# Patient Record
Sex: Male | Born: 1985 | Race: White | Hispanic: No | Marital: Single | Smoking: Current some day smoker
Health system: Southern US, Community
[De-identification: ages and names within clinical notes are randomized; demographics above are authoritative.]

## PROBLEM LIST (undated history)

## (undated) DIAGNOSIS — F1110 Opioid abuse, uncomplicated: Secondary | ICD-10-CM

## (undated) HISTORY — PX: FRACTURE SURGERY: SHX138

---
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
## 2003-01-05 ENCOUNTER — Emergency Department (HOSPITAL_COMMUNITY): Admission: EM | Admit: 2003-01-05 | Discharge: 2003-01-06 | Payer: Self-pay | Admitting: Emergency Medicine

## 2003-01-06 ENCOUNTER — Encounter: Payer: Self-pay | Admitting: Emergency Medicine

## 2003-10-03 ENCOUNTER — Emergency Department (HOSPITAL_COMMUNITY): Admission: EM | Admit: 2003-10-03 | Discharge: 2003-10-03 | Payer: Self-pay | Admitting: Emergency Medicine

## 2003-11-02 ENCOUNTER — Emergency Department (HOSPITAL_COMMUNITY): Admission: EM | Admit: 2003-11-02 | Discharge: 2003-11-02 | Payer: Self-pay | Admitting: Emergency Medicine

## 2004-01-11 ENCOUNTER — Emergency Department (HOSPITAL_COMMUNITY): Admission: EM | Admit: 2004-01-11 | Discharge: 2004-01-11 | Payer: Self-pay | Admitting: Emergency Medicine

## 2006-06-01 ENCOUNTER — Emergency Department (HOSPITAL_COMMUNITY): Admission: EM | Admit: 2006-06-01 | Discharge: 2006-06-01 | Payer: Self-pay | Admitting: Emergency Medicine

## 2007-08-07 ENCOUNTER — Emergency Department (HOSPITAL_COMMUNITY): Admission: EM | Admit: 2007-08-07 | Discharge: 2007-08-07 | Payer: Self-pay | Admitting: Emergency Medicine

## 2008-12-05 ENCOUNTER — Emergency Department (HOSPITAL_COMMUNITY): Admission: EM | Admit: 2008-12-05 | Discharge: 2008-12-05 | Payer: Self-pay | Admitting: *Deleted

## 2009-01-14 ENCOUNTER — Emergency Department (HOSPITAL_COMMUNITY): Admission: EM | Admit: 2009-01-14 | Discharge: 2009-01-14 | Payer: Self-pay | Admitting: Emergency Medicine

## 2009-10-29 ENCOUNTER — Emergency Department (HOSPITAL_COMMUNITY): Admission: EM | Admit: 2009-10-29 | Discharge: 2009-10-29 | Payer: Self-pay | Admitting: Emergency Medicine

## 2009-11-28 ENCOUNTER — Emergency Department (HOSPITAL_COMMUNITY): Admission: EM | Admit: 2009-11-28 | Discharge: 2009-11-28 | Payer: Self-pay | Admitting: Emergency Medicine

## 2010-05-15 ENCOUNTER — Emergency Department (HOSPITAL_COMMUNITY): Admission: EM | Admit: 2010-05-15 | Discharge: 2010-05-15 | Payer: Self-pay | Admitting: Emergency Medicine

## 2010-05-22 ENCOUNTER — Emergency Department (HOSPITAL_COMMUNITY): Admission: EM | Admit: 2010-05-22 | Discharge: 2010-05-22 | Payer: Self-pay | Admitting: Emergency Medicine

## 2010-05-24 ENCOUNTER — Emergency Department (HOSPITAL_COMMUNITY): Admission: EM | Admit: 2010-05-24 | Discharge: 2010-05-24 | Payer: Self-pay | Admitting: Emergency Medicine

## 2010-05-28 ENCOUNTER — Emergency Department (HOSPITAL_COMMUNITY): Admission: EM | Admit: 2010-05-28 | Discharge: 2010-05-28 | Payer: Self-pay | Admitting: Emergency Medicine

## 2010-06-01 ENCOUNTER — Emergency Department (HOSPITAL_COMMUNITY): Admission: EM | Admit: 2010-06-01 | Discharge: 2010-06-01 | Payer: Self-pay | Admitting: Emergency Medicine

## 2010-06-07 ENCOUNTER — Ambulatory Visit (HOSPITAL_COMMUNITY): Admission: RE | Admit: 2010-06-07 | Discharge: 2010-06-07 | Payer: Self-pay | Admitting: Otolaryngology

## 2011-03-11 LAB — CBC
Hemoglobin: 15.3 g/dL (ref 13.0–17.0)
RBC: 4.71 MIL/uL (ref 4.22–5.81)
WBC: 9.8 10*3/uL (ref 4.0–10.5)

## 2011-03-12 LAB — POCT I-STAT, CHEM 8
BUN: 9 mg/dL (ref 6–23)
Creatinine, Ser: 1.3 mg/dL (ref 0.4–1.5)
Sodium: 139 mEq/L (ref 135–145)
TCO2: 20 mmol/L (ref 0–100)

## 2011-03-12 LAB — ETHANOL: Alcohol, Ethyl (B): 99 mg/dL — ABNORMAL HIGH (ref 0–10)

## 2011-03-12 LAB — CBC
HCT: 45.7 % (ref 39.0–52.0)
Hemoglobin: 16 g/dL (ref 13.0–17.0)
Platelets: 194 10*3/uL (ref 150–400)
WBC: 14.5 10*3/uL — ABNORMAL HIGH (ref 4.0–10.5)

## 2011-03-12 LAB — DIFFERENTIAL
Eosinophils Relative: 0 % (ref 0–5)
Lymphocytes Relative: 9 % — ABNORMAL LOW (ref 12–46)
Lymphs Abs: 1.4 10*3/uL (ref 0.7–4.0)

## 2011-04-09 LAB — RAPID URINE DRUG SCREEN, HOSP PERFORMED
Amphetamines: NOT DETECTED
Barbiturates: NOT DETECTED
Benzodiazepines: NOT DETECTED
Cocaine: POSITIVE — AB
Opiates: NOT DETECTED

## 2011-04-09 LAB — POCT CARDIAC MARKERS: Myoglobin, poc: 121 ng/mL (ref 12–200)

## 2011-04-22 ENCOUNTER — Emergency Department (HOSPITAL_COMMUNITY)
Admission: EM | Admit: 2011-04-22 | Discharge: 2011-04-22 | Disposition: A | Discharge status: Home / Self Care | Payer: Self-pay | Attending: Emergency Medicine | Admitting: Emergency Medicine

## 2011-04-22 DIAGNOSIS — R109 Unspecified abdominal pain: Secondary | ICD-10-CM | POA: Insufficient documentation

## 2011-04-22 DIAGNOSIS — B029 Zoster without complications: Secondary | ICD-10-CM | POA: Insufficient documentation

## 2011-04-22 LAB — URINALYSIS, ROUTINE W REFLEX MICROSCOPIC
Glucose, UA: NEGATIVE mg/dL
Specific Gravity, Urine: 1.017 (ref 1.005–1.030)
pH: 6.5 (ref 5.0–8.0)

## 2011-07-17 ENCOUNTER — Emergency Department (HOSPITAL_COMMUNITY)
Admission: EM | Admit: 2011-07-17 | Discharge: 2011-07-17 | Disposition: A | Discharge status: Home / Self Care | Payer: Self-pay | Attending: Emergency Medicine | Admitting: Emergency Medicine

## 2011-07-17 DIAGNOSIS — M549 Dorsalgia, unspecified: Secondary | ICD-10-CM | POA: Insufficient documentation

## 2011-07-17 DIAGNOSIS — M545 Low back pain, unspecified: Secondary | ICD-10-CM | POA: Insufficient documentation

## 2011-08-17 ENCOUNTER — Emergency Department (HOSPITAL_COMMUNITY)
Admission: EM | Admit: 2011-08-17 | Discharge: 2011-08-17 | Disposition: A | Discharge status: Home / Self Care | Payer: Self-pay | Attending: Emergency Medicine | Admitting: Emergency Medicine

## 2011-08-17 DIAGNOSIS — M7989 Other specified soft tissue disorders: Secondary | ICD-10-CM | POA: Insufficient documentation

## 2011-08-17 DIAGNOSIS — T63461A Toxic effect of venom of wasps, accidental (unintentional), initial encounter: Secondary | ICD-10-CM | POA: Insufficient documentation

## 2011-08-17 DIAGNOSIS — T6391XA Toxic effect of contact with unspecified venomous animal, accidental (unintentional), initial encounter: Secondary | ICD-10-CM | POA: Insufficient documentation

## 2011-10-08 LAB — DIFFERENTIAL
Basophils Absolute: 0
Basophils Relative: 0
Eosinophils Absolute: 0.2
Monocytes Absolute: 0.7
Neutro Abs: 6.1
Neutrophils Relative %: 57

## 2011-10-08 LAB — POCT CARDIAC MARKERS
CKMB, poc: <1 — ABNORMAL LOW
Operator id: 208821
Operator id: 208821
Troponin i, poc: <0.05
Troponin i, poc: <0.05

## 2011-10-08 LAB — COMPREHENSIVE METABOLIC PANEL
ALT: 21
Alkaline Phosphatase: 72
BUN: 11
Chloride: 107
Glucose, Bld: 92
Potassium: 3.7
Sodium: 140
Total Bilirubin: 0.7

## 2011-10-08 LAB — CBC
HCT: 43.2
Hemoglobin: 15
RBC: 4.83
WBC: 10.6 — ABNORMAL HIGH

## 2011-10-08 LAB — LIPASE, BLOOD: Lipase: 19

## 2011-11-24 ENCOUNTER — Encounter: Payer: Self-pay | Admitting: *Deleted

## 2011-11-24 ENCOUNTER — Emergency Department (HOSPITAL_COMMUNITY)
Admission: EM | Admit: 2011-11-24 | Discharge: 2011-11-24 | Disposition: A | Discharge status: Home / Self Care | Payer: Self-pay | Attending: Emergency Medicine | Admitting: Emergency Medicine

## 2011-11-24 DIAGNOSIS — R3589 Other polyuria: Secondary | ICD-10-CM | POA: Insufficient documentation

## 2011-11-24 DIAGNOSIS — R358 Other polyuria: Secondary | ICD-10-CM | POA: Insufficient documentation

## 2011-11-24 DIAGNOSIS — R634 Abnormal weight loss: Secondary | ICD-10-CM | POA: Insufficient documentation

## 2011-11-24 LAB — CBC
HCT: 43.1 % (ref 39.0–52.0)
MCH: 31.4 pg (ref 26.0–34.0)
MCHC: 35.5 g/dL (ref 30.0–36.0)
MCV: 88.3 fL (ref 78.0–100.0)
RDW: 12.7 % (ref 11.5–15.5)

## 2011-11-24 LAB — URINALYSIS, ROUTINE W REFLEX MICROSCOPIC
Bilirubin Urine: NEGATIVE
Ketones, ur: NEGATIVE mg/dL
Nitrite: NEGATIVE
Protein, ur: NEGATIVE mg/dL
Urobilinogen, UA: 0.2 mg/dL (ref 0.0–1.0)
pH: 7.5 (ref 5.0–8.0)

## 2011-11-24 LAB — BASIC METABOLIC PANEL
BUN: 13 mg/dL (ref 6–23)
Calcium: 9.1 mg/dL (ref 8.4–10.5)
Creatinine, Ser: 0.9 mg/dL (ref 0.50–1.35)
GFR calc Af Amer: >90 mL/min (ref >90)
GFR calc non Af Amer: >90 mL/min (ref >90)

## 2011-11-24 NOTE — ED Provider Notes (Signed)
History     CSN: [id number] Arrival date & time: 11/24/2011 10:47 AM   First MD Initiated Contact with Patient 11/24/11 1124      Chief Complaint  Patient presents with  . Weight Loss    (Consider location/radiation/quality/duration/timing/severity/associated sxs/prior treatment) HPI History provided by pt.   Pt c/o 18-20lbs weight loss over the past 1.5 months.  Started jogging approx 3 weeks ago.  Believes that he is more hungry and thirsty than nml and has polyuria as well.  No known FH Type 1 diabetes.  No recent vomiting, diarrhea, blood in stool but has noticed something white in stool recently.  It may have been the corn he ate the night before but his friend told him that it could be worms and now he is worried.  Denies peri-anal pruritis/pain.  Has not had fever, fatigue, night sweats, rash. Has had a cough for the past week.  Quit smoking 2 days ago.    History reviewed. No pertinent past medical history.  History reviewed. No pertinent past surgical history.  No family history on file.  History  Substance Use Topics  . Smoking status: Former Games developer  . Smokeless tobacco: Not on file  . Alcohol Use: No      Review of Systems  All other systems reviewed and are negative.    Allergies  Review of patient's allergies indicates no known allergies.  Home Medications   Current Outpatient Rx  Name Route Sig Dispense Refill  . OMEGA-3 FATTY ACIDS 1000 MG PO CAPS Oral Take 1 g by mouth daily.      Marland Kitchen GARLIC 10 MG PO CAPS Oral Take 1 capsule by mouth daily.      Marland Kitchen POTASSIUM GLUCONATE 595 MG PO CAPS Oral Take 1 capsule by mouth daily.        BP 125/81  Pulse 54  Resp 18  SpO2 97%  Physical Exam  Nursing note and vitals reviewed. Constitutional: He is oriented to person, place, and time. He appears well-developed and well-nourished. No distress.  HENT:  Head: Normocephalic and atraumatic.  Eyes:       Normal appearance  Neck: Normal range of motion.    Cardiovascular: Normal rate and regular rhythm.   Pulmonary/Chest: Effort normal and breath sounds normal.  Abdominal: Soft. Bowel sounds are normal. He exhibits no distension. There is no tenderness.  Genitourinary:       Testicles w/out edema, mass, tenderness.  Pt points to small lump he found on right testicle and it appears to be where epididymus meets testicle.  Musculoskeletal: He exhibits no edema and no tenderness.  Neurological: He is alert and oriented to person, place, and time.  Skin: Skin is warm and dry. No rash noted.  Psychiatric: He has a normal mood and affect. His behavior is normal.    ED Course  Procedures (including critical care time)   Labs Reviewed  CBC  BASIC METABOLIC PANEL  URINALYSIS, ROUTINE W REFLEX MICROSCOPIC   No results found.   1. Weight loss       MDM  Pt presents w/ c/o unintentional weight loss.  Recently started exercising.  No other sx suggestive of malignancy.  Pt looks well; no acute findings on exam.  Testicular exam performed because pt reports feeling a small lump that is ttp.  Testicles w/out masses/tenderness and I believe pt was feeling epididymus where it meets testicle on right.  Advised him to continue monthly exams.   Labs unremarkable.  Pt has  been reassured but advised to check weight once a week and follow up with a PCP for further evaluation.        Otilio Miu, [place] 11/24/11 2350

## 2011-11-24 NOTE — ED Notes (Addendum)
Patient ambulated to BR without difficulty.

## 2011-11-24 NOTE — ED Notes (Addendum)
Patient returned from BR.

## 2011-11-24 NOTE — ED Notes (Signed)
Pt concerned he eats and lives a healthy lifestyle but frequently feels hungry after eating large amts of food, friend told him he may have worms, pt presents to see

## 2011-11-25 NOTE — ED Provider Notes (Signed)
Medical screening examination/treatment/procedure(s) were performed by non-physician practitioner and as supervising physician I was immediately available for consultation/collaboration.   Dayton Bailiff, MD 11/25/11 [phone]

## 2013-08-31 ENCOUNTER — Encounter (HOSPITAL_COMMUNITY): Payer: Self-pay | Admitting: Emergency Medicine

## 2013-08-31 ENCOUNTER — Emergency Department (HOSPITAL_COMMUNITY)
Admission: EM | Admit: 2013-08-31 | Discharge: 2013-08-31 | Disposition: A | Discharge status: Home / Self Care | Payer: Self-pay | Attending: Emergency Medicine | Admitting: Emergency Medicine

## 2013-08-31 ENCOUNTER — Emergency Department (HOSPITAL_COMMUNITY): Payer: Self-pay

## 2013-08-31 DIAGNOSIS — Z79899 Other long term (current) drug therapy: Secondary | ICD-10-CM | POA: Insufficient documentation

## 2013-08-31 DIAGNOSIS — F17200 Nicotine dependence, unspecified, uncomplicated: Secondary | ICD-10-CM | POA: Insufficient documentation

## 2013-08-31 DIAGNOSIS — M25549 Pain in joints of unspecified hand: Secondary | ICD-10-CM | POA: Insufficient documentation

## 2013-08-31 DIAGNOSIS — M79641 Pain in right hand: Secondary | ICD-10-CM

## 2013-08-31 MED ORDER — TRAMADOL HCL 50 MG PO TABS: 50.0000 mg | ORAL_TABLET | Freq: Four times a day (QID) | ORAL | Status: DC | PRN

## 2013-08-31 MED ORDER — TRAMADOL HCL 50 MG PO TABS
50.0000 mg | ORAL_TABLET | Freq: Once | ORAL | Status: AC
  Administered 2013-08-31: 50 mg via ORAL
  Filled 2013-08-31: qty 1

## 2013-08-31 NOTE — Progress Notes (Signed)
[facility] provided pt with a list of primary care resources and a [facility] [employer] [facility] application.  °

## 2013-08-31 NOTE — ED Notes (Signed)
Pt reports 8/10 right index pain at the proximal joint. Pt reports that the pain has been increasing over the past three weeks and that there is a "knot" in that location. Pt denies any injury or trauma. Pt states he has been unable to work out as usual at the gym due to the pain. Pt is A/Ox4 and in NAD.

## 2013-08-31 NOTE — ED Provider Notes (Signed)
CSN: [id number]     Arrival date & time 08/31/13  1134 History   First MD Initiated Contact with Patient 08/31/13 1148     Chief Complaint  Patient presents with  . Hand Pain   (Consider location/radiation/quality/duration/timing/severity/associated sxs/prior Treatment) HPI Comments: Patient presenting with a mass of his right index finger at the level of the MCP.  He reports that he noticed the mass three weeks ago, but it just started to become painful over the past couple of days.  He reports that he does not think that the mass has changed in size.  Denies any injury to the area.  Denies any erythema or swelling of the area.  He has full ROM of his finger.  Denies fever or chills.  Reports that he has never had anything like this before.  The history is provided by the patient.    History reviewed. No pertinent past medical history. History reviewed. No pertinent past surgical history. No family history on file. History  Substance Use Topics  . Smoking status: Current Some Day Smoker -- 0.25 packs/day for 10 years    Types: Cigarettes  . Smokeless tobacco: Current User    Types: Chew  . Alcohol Use: No    Review of Systems  All other systems reviewed and are negative.    Allergies  Review of patient's allergies indicates no known allergies.  Home Medications   Current Outpatient Rx  Name  Route  Sig  Dispense  Refill  . acetaminophen (TYLENOL) 500 MG tablet   Oral   Take 1,000 mg by mouth every 6 (six) hours as needed for pain.         . vitamin C (ASCORBIC ACID) 500 MG tablet   Oral   Take 1,000 mg by mouth every morning.          BP 122/69  Pulse 44  Temp(Src) 98 F (36.7 C) (Oral)  Resp 20  SpO2 99% Physical Exam  Nursing note and vitals reviewed. Constitutional: He appears well-developed and well-nourished.  HENT:  Head: Normocephalic and atraumatic.  Mouth/Throat: Oropharynx is clear and moist.  Neck: Normal range of motion. Neck supple.   Cardiovascular: Normal rate, regular rhythm and normal heart sounds.   Musculoskeletal:  Small approximately 5 mm round bony growth at the MCP of the right index finger on the palmar surface.  Full ROM of the MCP.  No erythema, edema, or warmth.  Neurological: He is alert.  Skin: Skin is warm and dry. No erythema.  Psychiatric: He has a normal mood and affect.    ED Course  Procedures (including critical care time) Labs Review Labs Reviewed - No data to display Imaging Review Dg Hand Complete Right  08/31/2013   *RADIOLOGY REPORT*  Clinical Data: Right hand pain  RIGHT HAND - COMPLETE 3+ VIEW  Comparison: None.  Findings: No acute fracture or dislocation is noted.  No gross soft tissue abnormality is seen.  IMPRESSION: No acute abnormality noted.   Original Report Authenticated By: Alcide Clever, M.D.    MDM  No diagnosis found. Patient presenting with a small growth of the MCP of the right index finger.  Xray negative.  He denies trauma to the area.  No signs of infection.  Patient referred to Hand Surgery.    Pascal Lux [place], PA-C 09/03/13 1105

## 2013-09-04 NOTE — ED Provider Notes (Signed)
Medical screening examination/treatment/procedure(s) were performed by non-physician practitioner and as supervising physician I was immediately available for consultation/collaboration.   Audree Camel, MD 09/04/13 [phone]

## 2014-04-18 ENCOUNTER — Encounter (HOSPITAL_COMMUNITY): Payer: Self-pay | Admitting: Emergency Medicine

## 2014-04-18 ENCOUNTER — Emergency Department (HOSPITAL_COMMUNITY)
Admission: EM | Admit: 2014-04-18 | Discharge: 2014-04-18 | Disposition: A | Discharge status: Home / Self Care | Payer: Self-pay | Attending: Emergency Medicine | Admitting: Emergency Medicine

## 2014-04-18 DIAGNOSIS — F17200 Nicotine dependence, unspecified, uncomplicated: Secondary | ICD-10-CM | POA: Insufficient documentation

## 2014-04-18 DIAGNOSIS — J029 Acute pharyngitis, unspecified: Secondary | ICD-10-CM

## 2014-04-18 DIAGNOSIS — Z79899 Other long term (current) drug therapy: Secondary | ICD-10-CM | POA: Insufficient documentation

## 2014-04-18 DIAGNOSIS — J40 Bronchitis, not specified as acute or chronic: Secondary | ICD-10-CM

## 2014-04-18 DIAGNOSIS — J209 Acute bronchitis, unspecified: Secondary | ICD-10-CM | POA: Insufficient documentation

## 2014-04-18 MED ORDER — DOXYCYCLINE HYCLATE 100 MG PO TABS
100.0000 mg | ORAL_TABLET | Freq: Once | ORAL | Status: AC
Start: 2014-04-18 — End: 2014-04-18
  Administered 2014-04-18: 100 mg via ORAL
  Filled 2014-04-18: qty 1

## 2014-04-18 MED ORDER — DOXYCYCLINE HYCLATE 100 MG PO CAPS: 100.0000 mg | ORAL_CAPSULE | Freq: Two times a day (BID) | ORAL | Status: DC

## 2014-04-18 MED ORDER — IBUPROFEN 600 MG PO TABS: 600.0000 mg | ORAL_TABLET | Freq: Three times a day (TID) | ORAL | Status: DC | PRN

## 2014-04-18 MED ORDER — IBUPROFEN 800 MG PO TABS
800.0000 mg | ORAL_TABLET | Freq: Once | ORAL | Status: AC
  Administered 2014-04-18: 800 mg via ORAL
  Filled 2014-04-18: qty 1

## 2014-04-18 MED ORDER — HYDROCODONE-ACETAMINOPHEN 5-325 MG PO TABS
1.0000 | ORAL_TABLET | Freq: Once | ORAL | Status: AC
  Administered 2014-04-18: 1 via ORAL
  Filled 2014-04-18: qty 1

## 2014-04-18 MED ORDER — IBUPROFEN 200 MG PO TABS: 600.0000 mg | ORAL_TABLET | Freq: Once | ORAL | Status: DC

## 2014-04-18 NOTE — ED Notes (Addendum)
Pt has been coughing for two weeks with sore throat.  Pt woke up this am with coughing with blood tinged sputum x 3.  No fever at home.

## 2014-04-18 NOTE — ED Provider Notes (Signed)
CSN: [id number]     Arrival date & time 04/18/14  0915 History   First MD Initiated Contact with Patient 04/18/14 [phone]     Chief Complaint  Patient presents with  . Sore Throat  . Cough      HPI Patient for sore throat of the past 24 hours.  This morning he developed the cough it made his throat hurt worse.  Small amount of blood came up mixed with mucus.  He reports congestion over the past several days.  He denies shortness of breath.  No fevers or chills.  He smokes cigarettes.  No other complaints.   History reviewed. No pertinent past medical history. History reviewed. No pertinent past surgical history. History reviewed. No pertinent family history. History  Substance Use Topics  . Smoking status: Current Some Day Smoker -- 0.25 packs/day for 10 years    Types: Cigarettes  . Smokeless tobacco: Current User    Types: Chew  . Alcohol Use: Yes     Comment: 2 x week    Review of Systems  All other systems reviewed and are negative.     Allergies  Review of patient's allergies indicates no known allergies.  Home Medications   Prior to Admission medications   Medication Sig Start Date End Date Taking? Authorizing Provider  acetaminophen (TYLENOL) 500 MG tablet Take 1,000 mg by mouth every 6 (six) hours as needed for pain.    Historical Provider, MD  doxycycline (VIBRAMYCIN) 100 MG capsule Take 1 capsule (100 mg total) by mouth 2 (two) times daily. 04/18/14   Lyanne CoKevin M Skyelynn Rambeau, MD  ibuprofen (ADVIL,MOTRIN) 600 MG tablet Take 1 tablet (600 mg total) by mouth every 8 (eight) hours as needed. 04/18/14   Lyanne CoKevin M Harshith Pursell, MD  traMADol (ULTRAM) 50 MG tablet Take 1 tablet (50 mg total) by mouth every 6 (six) hours as needed for pain. 08/31/13   Heather Laisure, PA-C  vitamin C (ASCORBIC ACID) 500 MG tablet Take 1,000 mg by mouth every morning.    Historical Provider, MD   BP 139/88  Pulse 64  Temp(Src) 98.1 F (36.7 C) (Oral)  Resp 18  SpO2 97% Physical Exam  Nursing note and  vitals reviewed. Constitutional: He is oriented to person, place, and time. He appears well-developed and well-nourished.  Non-toxic appearance. He does not appear ill. No distress.  HENT:  Head: Normocephalic and atraumatic.  Mouth/Throat: Uvula is midline and mucous membranes are normal. Normal dentition. No uvula swelling. Posterior oropharyngeal erythema present. No oropharyngeal exudate.  Eyes: EOM are normal.  Neck: Normal range of motion. Neck supple. No tracheal tenderness present. No mass and no thyromegaly present.  Cardiovascular: Normal rate, regular rhythm, normal heart sounds and intact distal pulses.   Pulmonary/Chest: Effort normal and breath sounds normal. No stridor. No respiratory distress.  Abdominal: Soft. He exhibits no distension. There is no tenderness.  Musculoskeletal: Normal range of motion.  Neurological: He is alert and oriented to person, place, and time.  Skin: Skin is warm and dry.  Psychiatric: He has a normal mood and affect. Judgment normal.    ED Course  Procedures (including critical care time) Labs Review Labs Reviewed - No data to display  Imaging Review No results found.   EKG Interpretation None      MDM   Final diagnoses:  Pharyngitis  Bronchitis    Pharyngitis/bronchitis.  No hypoxia.  Vital signs normal.  Discharge home in good condition.    Lyanne CoKevin M Axell Trigueros, MD 04/18/14  0944 

## 2014-04-18 NOTE — Discharge Instructions (Signed)

## 2014-12-14 ENCOUNTER — Emergency Department (HOSPITAL_COMMUNITY): Payer: Self-pay

## 2014-12-14 ENCOUNTER — Emergency Department (HOSPITAL_COMMUNITY)
Admission: EM | Admit: 2014-12-14 | Discharge: 2014-12-14 | Disposition: A | Discharge status: Home / Self Care | Payer: Self-pay | Attending: Emergency Medicine | Admitting: Emergency Medicine

## 2014-12-14 ENCOUNTER — Encounter (HOSPITAL_COMMUNITY): Payer: Self-pay | Admitting: Emergency Medicine

## 2014-12-14 DIAGNOSIS — Z79899 Other long term (current) drug therapy: Secondary | ICD-10-CM | POA: Insufficient documentation

## 2014-12-14 DIAGNOSIS — R0789 Other chest pain: Secondary | ICD-10-CM | POA: Insufficient documentation

## 2014-12-14 DIAGNOSIS — Z792 Long term (current) use of antibiotics: Secondary | ICD-10-CM | POA: Insufficient documentation

## 2014-12-14 DIAGNOSIS — M25512 Pain in left shoulder: Secondary | ICD-10-CM | POA: Insufficient documentation

## 2014-12-14 DIAGNOSIS — Z720 Tobacco use: Secondary | ICD-10-CM | POA: Insufficient documentation

## 2014-12-14 DIAGNOSIS — R079 Chest pain, unspecified: Secondary | ICD-10-CM

## 2014-12-14 LAB — CBC
HEMATOCRIT: 45.3 % (ref 39.0–52.0)
Hemoglobin: 15.8 g/dL (ref 13.0–17.0)
MCH: 31.5 pg (ref 26.0–34.0)
MCHC: 34.9 g/dL (ref 30.0–36.0)
MCV: 90.2 fL (ref 78.0–100.0)
PLATELETS: 201 10*3/uL (ref 150–400)
RBC: 5.02 MIL/uL (ref 4.22–5.81)
RDW: 12.7 % (ref 11.5–15.5)
WBC: 7.5 10*3/uL (ref 4.0–10.5)

## 2014-12-14 LAB — BASIC METABOLIC PANEL
ANION GAP: 5 (ref 5–15)
BUN: 11 mg/dL (ref 6–23)
CHLORIDE: 105 meq/L (ref 96–112)
CO2: 27 mmol/L (ref 19–32)
CREATININE: 0.97 mg/dL (ref 0.50–1.35)
Calcium: 8.3 mg/dL — ABNORMAL LOW (ref 8.4–10.5)
GFR calc non Af Amer: >90 mL/min (ref >90)
Glucose, Bld: 106 mg/dL — ABNORMAL HIGH (ref 70–99)
POTASSIUM: 3.9 mmol/L (ref 3.5–5.1)
Sodium: 137 mmol/L (ref 135–145)

## 2014-12-14 LAB — I-STAT TROPONIN, ED: Troponin i, poc: 0 ng/mL (ref 0.00–0.08)

## 2014-12-14 MED ORDER — KETOROLAC TROMETHAMINE 30 MG/ML IJ SOLN
30.0000 mg | Freq: Once | INTRAMUSCULAR | Status: AC
  Administered 2014-12-14: 30 mg via INTRAVENOUS
  Filled 2014-12-14: qty 1

## 2014-12-14 MED ORDER — TRAMADOL HCL 50 MG PO TABS: 50.0000 mg | ORAL_TABLET | Freq: Four times a day (QID) | ORAL | Status: DC | PRN

## 2014-12-14 MED ORDER — FENTANYL CITRATE 0.05 MG/ML IJ SOLN
50.0000 ug | Freq: Once | INTRAMUSCULAR | Status: AC
  Administered 2014-12-14: 50 ug via INTRAVENOUS
  Filled 2014-12-14: qty 2

## 2014-12-14 MED ORDER — IBUPROFEN 800 MG PO TABS: 800.0000 mg | ORAL_TABLET | Freq: Three times a day (TID) | ORAL | Status: DC | PRN

## 2014-12-14 NOTE — ED Provider Notes (Signed)
TIME SEEN: 9:45 AM  CHIEF COMPLAINT: chest pain  HPI: Pt is a 28 y.o. 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 States he woke up this morning with left shoulder pain is well. Pain in the left shoulder seems worse with movement. Chest pain is not exertional or pleuritic. No associated shortness of breath, vomiting, diaphoresis or dizziness. He did have nausea. Reports grandfather with history of heart attack in his 1970s. Patient denies a prior history of PE or DVT, recent prolonged immobilization such as hospitalization or a long flight, fracture, surgery, trauma. No leg swelling or pain. No fever but has had cough recently.  ROS: See HPI Constitutional: no fever  Eyes: no drainage  ENT: no runny nose   Cardiovascular:  chest pain  Resp: no SOB  GI: no vomiting GU: no dysuria Integumentary: no rash  Allergy: no hives  Musculoskeletal: no leg swelling  Neurological: no slurred speech ROS otherwise negative  PAST MEDICAL HISTORY/PAST SURGICAL HISTORY:  History reviewed. No pertinent past medical history.  MEDICATIONS:  Prior to Admission medications   Medication Sig Start Date End Date Taking? Authorizing Provider  acetaminophen (TYLENOL) 500 MG tablet Take 1,000 mg by mouth every 6 (six) hours as needed for pain.    Historical Provider, MD  doxycycline (VIBRAMYCIN) 100 MG capsule Take 1 capsule (100 mg total) by mouth 2 (two) times daily. 04/18/14   Lyanne CoKevin M Campos, MD  ibuprofen (ADVIL,MOTRIN) 600 MG tablet Take 1 tablet (600 mg total) by mouth every 8 (eight) hours as needed. 04/18/14   Lyanne CoKevin M Campos, MD  traMADol (ULTRAM) 50 MG tablet Take 1 tablet (50 mg total) by mouth every 6 (six) hours as needed for pain. 08/31/13   Heather Laisure, PA-C  vitamin C  (ASCORBIC ACID) 500 MG tablet Take 1,000 mg by mouth every morning.    Historical Provider, MD    ALLERGIES:  No Known Allergies  SOCIAL HISTORY:  History  Substance Use Topics  . Smoking status: Current Some Day Smoker -- 0.25 packs/day for 10 years    Types: Cigarettes  . Smokeless tobacco: Current User    Types: Chew  . Alcohol Use: Yes     Comment: 2 x week    FAMILY HISTORY: History reviewed. No pertinent family history.  EXAM: BP 131/77 mmHg  Pulse 58  Temp(Src) 98.3 F (36.8 C) (Oral)  Resp 15  SpO2 98% CONSTITUTIONAL: Alert and oriented and responds appropriately to questions. Well-appearing; well-nourished HEAD: Normocephalic EYES: Conjunctivae clear, PERRL ENT: normal nose; no rhinorrhea; moist mucous membranes; pharynx without lesions noted NECK: Supple, no meningismus, no LAD  CARD: RRR; S1 and S2 appreciated; no murmurs, no clicks, no rubs, no gallops RESP: Normal chest excursion without splinting or tachypnea; breath sounds clear and equal bilaterally; no wheezes, no rhonchi, no rales, anterior chest wall is nontender to palpation, no crepitus or ecchymosis or deformity, speaking full sentences, no hypoxia ABD/GI: Normal bowel sounds; non-distended; soft, non-tender, no rebound, no guarding BACK:  The back appears normal and is non-tender to palpation, there is no CVA tenderness EXT: No calf tenderness or swelling, compartments soft, patient does have pain with range of motion of the left shoulder but has full range of motion in this joint with no joint  effusion, no bony deformity, 2+ radial pulses bilaterally, Normal ROM in all joints; otherwise extremities are non-tender to palpation; no edema; normal capillary refill; no cyanosis    SKIN: Normal color for age and race; warm NEURO: Moves all extremities equally, sensation to light touch intact diffusely PSYCH: The patient's mood and manner are appropriate. Grooming and personal hygiene are  appropriate.  MEDICAL DECISION MAKING: Patient here with very atypical chest pain that has been constant since last night at 10:30 PM. EKG shows no ischemic changes. We'll obtain chest x-ray. We'll give Toradol, fentanyl. His heart score is 1. PERC negative.  Labs pending. Anticipate if workup is negative, patient can be discharged home.  ED PROGRESS: Patient's labs are unremarkable. Troponin negative. Chest x-ray clear. Reports feeling better after Toradol and fentanyl. We'll discharge with prescriptions for ibuprofen, tramadol. Discussed the importance of outpatient follow-up. I do not feel there is any life-threatening process. I do not feel he needs serial sets of enzymes given his low heart score and pain has been constant since last night. Have discussed return precautions. He verbalized understanding and is comfortable with plan.     EKG Interpretation  Date/Time:  Tuesday December 14 2014 09:33:52 EST Ventricular Rate:  56 PR Interval:  139 QRS Duration: 88 QT Interval:  429 QTC Calculation: 414 R Axis:   86 Text Interpretation:  Sinus rhythm RSR' in V1 or V2, probably normal variant No significant change since last tracing Confirmed by WARD,  DO, KRISTEN [phone]([id number]) on 12/14/2014 9:41:55 AM          Layla MawKristen N Ward, DO 12/14/14 1104

## 2014-12-14 NOTE — ED Notes (Signed)
Pt states that he was sitting watching football, smoking a cigarette when he began having CP that radiates to lt shoulder.  States that he has had this pain before when he drank 2 Five hour energy drinks.  Pt denies using stimulants.

## 2014-12-14 NOTE — Discharge Instructions (Signed)
Chest Pain (Nonspecific) °It is often hard to give a specific diagnosis for the cause of chest pain. There is always a chance that your pain could be related to something serious, such as a heart attack or a blood clot in the lungs. You need to follow up with your health care provider for further evaluation. °CAUSES  °· Heartburn. °· Pneumonia or bronchitis. °· Anxiety or stress. °· Inflammation around your heart (pericarditis) or lung (pleuritis or pleurisy). °· A blood clot in the lung. °· A collapsed lung (pneumothorax). It can develop suddenly on its own (spontaneous pneumothorax) or from trauma to the chest. °· Shingles infection (herpes zoster virus). °The chest wall is composed of bones, muscles, and cartilage. Any of these can be the source of the pain. °· The bones can be bruised by injury. °· The muscles or cartilage can be strained by coughing or overwork. °· The cartilage can be affected by inflammation and become sore (costochondritis). °DIAGNOSIS  °Lab tests or other studies may be needed to find the cause of your pain. Your health care provider may have you take a test called an ambulatory electrocardiogram (ECG). An ECG records your heartbeat patterns over a 24-hour period. You may also have other tests, such as: °· Transthoracic echocardiogram (TTE). During echocardiography, sound waves are used to evaluate how blood flows through your heart. °· Transesophageal echocardiogram (TEE). °· Cardiac monitoring. This allows your health care provider to monitor your heart rate and rhythm in real time. °· Holter monitor. This is a portable device that records your heartbeat and can help diagnose heart arrhythmias. It allows your health care provider to track your heart activity for several days, if needed. °· Stress tests by exercise or by giving medicine that makes the heart beat faster. °TREATMENT  °· Treatment depends on what may be causing your chest pain. Treatment may include: °· Acid blockers for  heartburn. °· Anti-inflammatory medicine. °· Pain medicine for inflammatory conditions. °· Antibiotics if an infection is present. °· You may be advised to change lifestyle habits. This includes stopping smoking and avoiding alcohol, caffeine, and chocolate. °· You may be advised to keep your head raised (elevated) when sleeping. This reduces the chance of acid going backward from your stomach into your esophagus. °Most of the time, nonspecific chest pain will improve within 2-3 days with rest and mild pain medicine.  °HOME CARE INSTRUCTIONS  °· If antibiotics were prescribed, take them as directed. Finish them even if you start to feel better. °· For the next few days, avoid physical activities that bring on chest pain. Continue physical activities as directed. °· Do not use any tobacco products, including cigarettes, chewing tobacco, or electronic cigarettes. °· Avoid drinking alcohol. °· Only take medicine as directed by your health care provider. °· Follow your health care provider's suggestions for further testing if your chest pain does not go away. °· Keep any follow-up appointments you made. If you do not go to an appointment, you could develop lasting (chronic) problems with pain. If there is any problem keeping an appointment, call to reschedule. °SEEK MEDICAL CARE IF:  °· Your chest pain does not go away, even after treatment. °· You have a rash with blisters on your chest. °· You have a fever. °SEEK IMMEDIATE MEDICAL CARE IF:  °· You have increased chest pain or pain that spreads to your arm, neck, jaw, back, or abdomen. °· You have shortness of breath. °· You have an increasing cough, or you cough   up blood.  You have severe back or abdominal pain.  You feel nauseous or vomit.  You have severe weakness.  You faint.  You have chills. This is an emergency. Do not wait to see if the pain will go away. Get medical help at once. Call your local emergency services (911 in [place] Do not drive  yourself to the hospital. MAKE SURE YOU:   Understand these instructions.  Will watch your condition.  Will get help right away if you are not doing well or get worse. Document Released: 09/19/2005 Document Revised: 12/15/2013 Document Reviewed: 07/15/2008 [facility] Patient Information 2015 [place], [place]. This information is not intended to replace advice given to you by your health care provider. Make sure you discuss any questions you have with your health care provider.  Shoulder Pain The shoulder is the joint that connects your arms to your body. The bones that form the shoulder joint include the upper arm bone (humerus), the shoulder blade (scapula), and the collarbone (clavicle). The top of the humerus is shaped like a ball and fits into a rather flat socket on the scapula (glenoid cavity). A combination of muscles and strong, fibrous tissues that connect muscles to bones (tendons) support your shoulder joint and hold the ball in the socket. Small, fluid-filled sacs (bursae) are located in different areas of the joint. They act as cushions between the bones and the overlying soft tissues and help reduce friction between the gliding tendons and the bone as you move your arm. Your shoulder joint allows a wide range of motion in your arm. This range of motion allows you to do things like scratch your back or throw a ball. However, this range of motion also makes your shoulder more prone to pain from overuse and injury. Causes of shoulder pain can originate from both injury and overuse and usually can be grouped in the following four categories:  Redness, swelling, and pain (inflammation) of the tendon (tendinitis) or the bursae (bursitis).  Instability, such as a dislocation of the joint.  Inflammation of the joint (arthritis).  Broken bone (fracture). HOME CARE INSTRUCTIONS   Apply ice to the sore area.  Put ice in a plastic bag.  Place a towel between your skin and the bag.  Leave  the ice on for 15-20 minutes, 3-4 times per day for the first 2 days, or as directed by your health care provider.  Stop using cold packs if they do not help with the pain.  If you have a shoulder sling or immobilizer, wear it as long as your caregiver instructs. Only remove it to shower or bathe. Move your arm as little as possible, but keep your hand moving to prevent swelling.  Squeeze a soft ball or foam pad as much as possible to help prevent swelling.  Only take over-the-counter or prescription medicines for pain, discomfort, or fever as directed by your caregiver. SEEK MEDICAL CARE IF:   Your shoulder pain increases, or new pain develops in your arm, hand, or fingers.  Your hand or fingers become cold and numb.  Your pain is not relieved with medicines. SEEK IMMEDIATE MEDICAL CARE IF:   Your arm, hand, or fingers are numb or tingling.  Your arm, hand, or fingers are significantly swollen or turn white or blue. MAKE SURE YOU:   Understand these instructions.  Will watch your condition.  Will get help right away if you are not doing well or get worse. Document Released: 09/19/2005 Document Revised: 04/26/2014 Document  Reviewed: 11/24/2011 °ExitCare® Patient Information ©2015 [employer]. This information is not intended to replace advice given to you by your health care provider. Make sure you discuss any questions you have with your health care provider. ° °

## 2014-12-16 ENCOUNTER — Encounter (HOSPITAL_COMMUNITY): Payer: Self-pay | Admitting: Emergency Medicine

## 2014-12-16 ENCOUNTER — Emergency Department (HOSPITAL_COMMUNITY)
Admission: EM | Admit: 2014-12-16 | Discharge: 2014-12-16 | Disposition: A | Discharge status: Home / Self Care | Payer: Self-pay | Attending: Emergency Medicine | Admitting: Emergency Medicine

## 2014-12-16 ENCOUNTER — Emergency Department (HOSPITAL_COMMUNITY): Payer: Self-pay

## 2014-12-16 DIAGNOSIS — Z590 Homelessness: Secondary | ICD-10-CM | POA: Insufficient documentation

## 2014-12-16 DIAGNOSIS — Z720 Tobacco use: Secondary | ICD-10-CM | POA: Insufficient documentation

## 2014-12-16 DIAGNOSIS — R079 Chest pain, unspecified: Secondary | ICD-10-CM | POA: Insufficient documentation

## 2014-12-16 DIAGNOSIS — R0602 Shortness of breath: Secondary | ICD-10-CM | POA: Insufficient documentation

## 2014-12-16 MED ORDER — IBUPROFEN 800 MG PO TABS
800.0000 mg | ORAL_TABLET | Freq: Once | ORAL | Status: AC
  Administered 2014-12-16: 800 mg via ORAL
  Filled 2014-12-16: qty 1

## 2014-12-16 NOTE — ED Notes (Signed)
Pt homeless has been walking all night. C/o of chest pain and leg cramps. ETOH. Anxiety

## 2014-12-16 NOTE — ED Notes (Signed)
Bed: [id number] Expected date:  Expected time:  Means of arrival:  Comments: EMS-40 oz/weak

## 2014-12-16 NOTE — Discharge Instructions (Signed)

## 2014-12-16 NOTE — ED Provider Notes (Addendum)
CSN: [id number]     Arrival date & time 12/16/14  [id number] History   First MD Initiated Contact with Patient 12/16/14 0751     Chief Complaint  Patient presents with  . Shortness of Breath     (Consider location/radiation/quality/duration/timing/severity/associated sxs/prior Treatment) HPI Comments: Patient here complaining of left-sided upper back and shoulder pain radiating to his chest is worse with movement. Patient symptoms have been present for the past several hours. States he has been walking around on [address] because he is homeless. Denies any associated dyspnea or diaphoresis. Does admit to drinking alcohol yesterday evening. No fever or cough. No syncope or near-syncope Evaluated for similar symptoms 2 days ago and that workup was reviewed and was negative. No prior history of DT or PE. Denies any leg pain or swelling. Symptoms have persisted and better with remaining still. Called EMS and was transported here. No treatment use prior to arrival.  Patient is a 28 y.o. male presenting with shortness of breath. The history is provided by the patient.  Shortness of Breath   History reviewed. No pertinent past medical history. History reviewed. No pertinent past surgical history. History reviewed. No pertinent family history. History  Substance Use Topics  . Smoking status: Current Some Day Smoker -- 0.25 packs/day for 10 years    Types: Cigarettes  . Smokeless tobacco: Current User    Types: Chew  . Alcohol Use: Yes     Comment: 2 x week    Review of Systems  Respiratory: Positive for shortness of breath.   All other systems reviewed and are negative.     Allergies  Review of patient's allergies indicates no known allergies.  Home Medications   Prior to Admission medications   Medication Sig Start Date End Date Taking? Authorizing Provider  acetaminophen (TYLENOL) 500 MG tablet Take 1,000 mg by mouth every 6 (six) hours as needed for mild pain.     Historical  Provider, MD  doxycycline (VIBRAMYCIN) 100 MG capsule Take 1 capsule (100 mg total) by mouth 2 (two) times daily. Patient not taking: Reported on 12/14/2014 04/18/14   Lyanne CoKevin M Campos, MD  ibuprofen (ADVIL,MOTRIN) 800 MG tablet Take 1 tablet (800 mg total) by mouth every 8 (eight) hours as needed for mild pain. 12/14/14   Kristen N Ward, DO  traMADol (ULTRAM) 50 MG tablet Take 1 tablet (50 mg total) by mouth every 6 (six) hours as needed. 12/14/14   Kristen N Ward, DO   BP 124/91 mmHg  Pulse 101  Temp(Src) 98.4 F (36.9 C) (Oral)  Resp 20  SpO2 100% Physical Exam  Constitutional: He is oriented to person, place, and time. He appears well-developed and well-nourished.  Non-toxic appearance. No distress.  HENT:  Head: Normocephalic and atraumatic.  Eyes: Conjunctivae, EOM and lids are normal. Pupils are equal, round, and reactive to light.  Neck: Normal range of motion. Neck supple. No tracheal deviation present. No thyroid mass present.  Cardiovascular: Normal rate, regular rhythm and normal heart sounds.  Exam reveals no gallop.   No murmur heard. Pulmonary/Chest: Effort normal and breath sounds normal. No stridor. No respiratory distress. He has no decreased breath sounds. He has no wheezes. He has no rhonchi. He has no rales.  Abdominal: Soft. Normal appearance and bowel sounds are normal. He exhibits no distension. There is no tenderness. There is no rebound and no CVA tenderness.  Musculoskeletal: Normal range of motion. He exhibits no edema or tenderness.  Neurological: He is alert and  oriented to person, place, and time. He has normal strength. No cranial nerve deficit or sensory deficit. GCS eye subscore is 4. GCS verbal subscore is 5. GCS motor subscore is 6.  Skin: Skin is warm and dry. No abrasion and no rash noted.  Psychiatric: He has a normal mood and affect. His speech is normal and behavior is normal.  Nursing note and vitals reviewed.   ED Course  Procedures (including  critical care time) Labs Review Labs Reviewed - No data to display  Imaging Review Dg Chest 2 View  12/14/2014   CLINICAL DATA:  Chest pain and difficulty breathing for 1 day  EXAM: CHEST  2 VIEW  COMPARISON:  May 15, 2010  FINDINGS: Lungs are clear. Heart size and pulmonary vascularity are normal. No adenopathy. No pneumothorax. No bone lesions.  IMPRESSION: No abnormality noted.   Electronically Signed   By: Bretta BangWilliam  Woodruff M.D.   On: 12/14/2014 10:19     EKG Interpretation   Date/Time:  Thursday December 16 2014 08:10:50 EST Ventricular Rate:  84 PR Interval:  166 QRS Duration: 86 QT Interval:  372 QTC Calculation: 440 R Axis:   74 Text Interpretation:  Sinus rhythm No significant change since last  tracing Confirmed by Maryrose Colvin  MD, Nioma Mccubbins ([id number]) on 12/16/2014 8:46:10 AM      MDM   Final diagnoses:  SOB (shortness of breath)    Patient given ibuprofen and feels better. Suspect musculoskeletal pain. Stable for discharge    Toy BakerAnthony T Lakysha Kossman, MD 12/16/14 [id number]  Toy BakerAnthony T Tanaiya Kolarik, MD 12/16/14 [phone]

## 2015-03-14 ENCOUNTER — Encounter (HOSPITAL_COMMUNITY): Payer: Self-pay | Admitting: Emergency Medicine

## 2015-03-14 ENCOUNTER — Emergency Department (HOSPITAL_COMMUNITY): Payer: Self-pay

## 2015-03-14 ENCOUNTER — Emergency Department (HOSPITAL_COMMUNITY)
Admission: EM | Admit: 2015-03-14 | Discharge: 2015-03-14 | Disposition: A | Discharge status: Home / Self Care | Payer: Self-pay | Attending: Emergency Medicine | Admitting: Emergency Medicine

## 2015-03-14 DIAGNOSIS — W540XXA Bitten by dog, initial encounter: Secondary | ICD-10-CM | POA: Insufficient documentation

## 2015-03-14 DIAGNOSIS — Z720 Tobacco use: Secondary | ICD-10-CM | POA: Insufficient documentation

## 2015-03-14 DIAGNOSIS — Y998 Other external cause status: Secondary | ICD-10-CM | POA: Insufficient documentation

## 2015-03-14 DIAGNOSIS — Y9389 Activity, other specified: Secondary | ICD-10-CM | POA: Insufficient documentation

## 2015-03-14 DIAGNOSIS — Z23 Encounter for immunization: Secondary | ICD-10-CM | POA: Insufficient documentation

## 2015-03-14 DIAGNOSIS — S61253A Open bite of left middle finger without damage to nail, initial encounter: Secondary | ICD-10-CM | POA: Insufficient documentation

## 2015-03-14 DIAGNOSIS — Y9289 Other specified places as the place of occurrence of the external cause: Secondary | ICD-10-CM | POA: Insufficient documentation

## 2015-03-14 DIAGNOSIS — S61251A Open bite of left index finger without damage to nail, initial encounter: Secondary | ICD-10-CM | POA: Insufficient documentation

## 2015-03-14 MED ORDER — OXYCODONE-ACETAMINOPHEN 5-325 MG PO TABS
2.0000 | ORAL_TABLET | Freq: Once | ORAL | Status: AC
  Administered 2015-03-14: 2 via ORAL
  Filled 2015-03-14: qty 2

## 2015-03-14 MED ORDER — AMOXICILLIN-POT CLAVULANATE 875-125 MG PO TABS: 1.0000 | ORAL_TABLET | Freq: Two times a day (BID) | ORAL | Status: DC

## 2015-03-14 MED ORDER — OXYCODONE-ACETAMINOPHEN 5-325 MG PO TABS: 1.0000 | ORAL_TABLET | Freq: Four times a day (QID) | ORAL | Status: DC | PRN

## 2015-03-14 MED ORDER — OXYCODONE-ACETAMINOPHEN 5-325 MG PO TABS
1.0000 | ORAL_TABLET | Freq: Once | ORAL | Status: DC
  Filled 2015-03-14: qty 1

## 2015-03-14 MED ORDER — TETANUS-DIPHTH-ACELL PERTUSSIS 5-2.5-18.5 LF-MCG/0.5 IM SUSP
0.5000 mL | Freq: Once | INTRAMUSCULAR | Status: AC
  Administered 2015-03-14: 0.5 mL via INTRAMUSCULAR
  Filled 2015-03-14: qty 0.5

## 2015-03-14 MED ORDER — CEFAZOLIN SODIUM 1-5 GM-% IV SOLN
1.0000 g | Freq: Once | INTRAVENOUS | Status: AC
  Administered 2015-03-14: 1 g via INTRAVENOUS
  Filled 2015-03-14: qty 50

## 2015-03-14 NOTE — Discharge Instructions (Signed)
Take the antibiotics prescribed to you for the full course!! DO NOT stop antibiotics early for at the risk of infection. Follow up with a hand specialist for wound recheck within the week. Call tomorrow morning to schedule an appointment. You may take Norco as needed for pain and Ibuprofen  every 6 hours for additional pain relief. Return to an ED if symptoms worsen.  Animal Bite An animal bite can result in a scratch on the skin, deep open cut, puncture of the skin, crush injury, or tearing away of the skin or a body part. Dogs are responsible for most animal bites. Children are bitten more often than adults. An animal bite can range from very mild to more serious. A small bite from your house pet is no cause for alarm. However, some animal bites can become infected or injure a bone or other tissue. You must seek medical care if:  The skin is broken and bleeding does not slow down or stop after 15 minutes.  The puncture is deep and difficult to clean (such as a cat bite).  Pain, warmth, redness, or pus develops around the wound.  The bite is from a stray animal or rodent. There may be a risk of rabies infection.  The bite is from a snake, raccoon, skunk, fox, coyote, or bat. There may be a risk of rabies infection.  The person bitten has a chronic illness such as diabetes, liver disease, or cancer, or the person takes medicine that lowers the immune system.  There is concern about the location and severity of the bite. It is important to clean and protect an animal bite wound right away to prevent infection. Follow these steps:  Clean the wound with plenty of water and soap.  Apply an antibiotic cream.  Apply gentle pressure over the wound with a clean towel or gauze to slow or stop bleeding.  Elevate the affected area above the heart to help stop any bleeding.  Seek medical care. Getting medical care within 8 hours of the animal bite leads to the best possible outcome. DIAGNOSIS    Your caregiver will most likely:  Take a detailed history of the animal and the bite injury.  Perform a wound exam.  Take your medical history. Blood tests or X-rays may be performed. Sometimes, infected bite wounds are cultured and sent to a lab to identify the infectious bacteria.  TREATMENT  Medical treatment will depend on the location and type of animal bite as well as the patient's medical history. Treatment may include:  Wound care, such as cleaning and flushing the wound with saline solution, bandaging, and elevating the affected area.  Antibiotics.  Tetanus immunization.  Rabies immunization.  Leaving the wound open to heal. This is often done with animal bites, due to the high risk of infection. However, in certain cases, wound closure with stitches, wound adhesive, skin adhesive strips, or staples may be used. Infected bites that are left untreated may require intravenous (IV) antibiotics and surgical treatment in the hospital. HOME CARE INSTRUCTIONS  Follow your caregiver's instructions for wound care.  Take all medicines as directed.  If your caregiver prescribes antibiotics, take them as directed. Finish them even if you start to feel better.  Follow up with your caregiver for further exams or immunizations as directed. You may need a tetanus shot if:  You cannot remember when you had your last tetanus shot.  You have never had a tetanus shot.  The injury broke your skin. If  you get a tetanus shot, your arm may swell, get red, and feel warm to the touch. This is common and not a problem. If you need a tetanus shot and you choose not to have one, there is a rare chance of getting tetanus. Sickness from tetanus can be serious. SEEK MEDICAL CARE IF:  You notice warmth, redness, soreness, swelling, pus discharge, or a bad smell coming from the wound.  You have a red line on the skin coming from the wound.  You have a fever, chills, or a general ill  feeling.  You have nausea or vomiting.  You have continued or worsening pain.  You have trouble moving the injured part.  You have other questions or concerns. MAKE SURE YOU:  Understand these instructions.  Will watch your condition.  Will get help right away if you are not doing well or get worse. Document Released: 08/28/2011 Document Revised: 03/03/2012 Document Reviewed: 08/28/2011 [facility] Patient Information 2015 [place] CityExitCare, [place]LLC. This information is not intended to replace advice given to you by your health care provider. Make sure you discuss any questions you have with your health care provider.

## 2015-03-14 NOTE — ED Notes (Signed)
Pt states that he was petting his friend's dog when it bit his L hand. Bleeding controlled. Friend states that dog is up to date on vaccinations/rabies. Alert and oriented.

## 2015-03-14 NOTE — ED Provider Notes (Signed)
CSN: [id number]     Arrival date & time 03/14/15  1718 History  This chart was scribed for non-physician practitioner, Antony MaduraKelly Kshawn Canal, PA-C,  working with Tilden FossaElizabeth Rees, MD, by Modena JanskyAlbert Thayil, ED Scribe. This patient was seen in room WTR6/WTR6 and the patient's care was started at 8:06 PM.  Chief Complaint  Patient presents with  . Animal Bite   Patient is a 29 y.o. male presenting with animal bite. The history is provided by the patient. No language interpreter was used.  Animal Bite Contact animal:  Dog Location:  Hand Hand injury location:  L fingers Time since incident:  3 hours Pain details:    Severity:  Moderate   Timing:  Constant Animal's rabies vaccination status:  Up to date Tetanus status:  Unknown Relieved by:  None tried Worsened by:  Nothing tried Ineffective treatments:  None tried  HPI Comments: Jason Wilcox is a 29 y.o. male who presents to the Emergency Department complaining of an animal bite that occurred about 3 hours ago. He states that he went to pet his neighbor's dog when the dog bite his left hand. He states that he has constant moderate left hand pain. He states no modifying factors for the pain. He reports that the dog's vaccinations are UTD. He reports that he is right handed. He states that he is unsure of his tetanus status.   History reviewed. No pertinent past medical history. History reviewed. No pertinent past surgical history. History reviewed. No pertinent family history. History  Substance Use Topics  . Smoking status: Current Some Day Smoker -- 0.25 packs/day for 10 years    Types: Cigarettes  . Smokeless tobacco: Current User    Types: Chew  . Alcohol Use: Yes     Comment: 2 x week    Review of Systems  Musculoskeletal: Positive for myalgias.  Skin: Positive for wound.  All other systems reviewed and are negative.   Allergies  Review of patient's allergies indicates no known allergies.  Home Medications   Prior to Admission  medications   Medication Sig Start Date End Date Taking? Authorizing Provider  acetaminophen (TYLENOL) 500 MG tablet Take 1,000 mg by mouth every 6 (six) hours as needed for mild pain.     Historical Provider, MD  doxycycline (VIBRAMYCIN) 100 MG capsule Take 1 capsule (100 mg total) by mouth 2 (two) times daily. Patient not taking: Reported on 12/14/2014 04/18/14   Azalia BilisKevin Campos, MD  ibuprofen (ADVIL,MOTRIN) 800 MG tablet Take 1 tablet (800 mg total) by mouth every 8 (eight) hours as needed for mild pain. 12/14/14   Kristen N Ward, DO  traMADol (ULTRAM) 50 MG tablet Take 1 tablet (50 mg total) by mouth every 6 (six) hours as needed. 12/14/14   Kristen N Ward, DO   BP 133/77 mmHg  Pulse 68  Temp(Src) 98.3 F (36.8 C) (Oral)  SpO2 100%  Physical Exam  Constitutional: He is oriented to person, place, and time. He appears well-developed and well-nourished. No distress.  Nontoxic/nonseptic appearing  HENT:  Head: Normocephalic and atraumatic.  Eyes: Conjunctivae and EOM are normal. No scleral icterus.  Neck: Normal range of motion.  Cardiovascular: Normal rate, regular rhythm and intact distal pulses.   Distal radial pulse 2+ in the left upper extremity. Capillary refill normal in all digits of left hand.  Pulmonary/Chest: Effort normal. No respiratory distress.  Respirations even and unlabored  Musculoskeletal:       Left hand: He exhibits decreased range of motion (  decreased AROM secondary to pain; normal PROM), tenderness, laceration and swelling. He exhibits no bony tenderness, normal capillary refill and no deformity. Normal sensation noted. Normal strength noted.       Hands: Multiple punctate lacerations to the L 2nd and 3rd digits. Mild soft tissue swelling to the L 3rd digit without erythema or heat to touch. No purulent drainage. No crepitus or deformity.  Neurological: He is alert and oriented to person, place, and time. He exhibits normal muscle tone. Coordination normal.   Sensation to light touch intact in the distal tips of all digits of the left hand. Patient able to wiggle all fingers.  Skin: Skin is warm and dry. No rash noted. He is not diaphoretic. No erythema. No pallor.  Psychiatric: He has a normal mood and affect. His behavior is normal.  Nursing note and vitals reviewed.   ED Course  Procedures (including critical care time) DIAGNOSTIC STUDIES: Oxygen Saturation is 100% on RA, normal by my interpretation.    COORDINATION OF CARE: 8:10 PM- Pt advised of plan for treatment which includes medication and pt agrees.  Labs Review Labs Reviewed - No data to display  Imaging Review Dg Hand Complete Left  03/14/2015   CLINICAL DATA:  Bit by dog.  EXAM: LEFT HAND - COMPLETE 3+ VIEW  COMPARISON:  None.  FINDINGS: There is air in the soft tissues in the webspace between the second and third fingers. No radiopaque foreign body. No fracture is identified.  IMPRESSION: No acute fracture or radiopaque foreign body.   Electronically Signed   By: Rudie Meyer M.D.   On: 03/14/2015 20:45     EKG Interpretation None      Medications  oxyCODONE-acetaminophen (PERCOCET/ROXICET) 5-325 MG per tablet 2 tablet (2 tablets Oral Given 03/14/15 2030)  ceFAZolin (ANCEF) IVPB 1 g/50 mL premix (0 g Intravenous Stopped 03/14/15 2133)  Tdap (BOOSTRIX) injection 0.5 mL (0.5 mLs Intramuscular Given 03/14/15 2030)    MDM   Final diagnoses:  Dog bite    30 year old male presents to the emergency department for further evaluation of dog bite to hand. Patient neurovascularly intact. No evidence of fracture on x-ray. Patient given Ancef in ED as well as tetanus shot. Puncture wounds treated with pressure irrigation and hand also soaked in Betadine and sterile water. No indication for wound closure; the will be left open. Patient be referred to hand specialist to ensure proper wound healing. Patient placed on course of Augmentin which he has been instructed to continue for the  full course. Return precautions discussed and provided. Patient agreeable to plan with no unaddressed concerns.  I personally performed the services described in this documentation, which was scribed in my presence. The recorded information has been reviewed and is accurate.   Filed Vitals:   03/14/15 1725 03/14/15 2154  BP: 133/77 133/86  Pulse: 68 77  Temp: 98.3 F (36.8 C) 98.1 F (36.7 C)  TempSrc: Oral Oral  Resp:  16  SpO2: 100% 100%       Antony Madura, PA-C 03/15/15 0028  Tilden Fossa, MD 03/15/15 [phone]

## 2015-06-28 ENCOUNTER — Emergency Department (HOSPITAL_COMMUNITY): Payer: Self-pay

## 2015-06-28 ENCOUNTER — Emergency Department (HOSPITAL_COMMUNITY)
Admission: EM | Admit: 2015-06-28 | Discharge: 2015-06-28 | Disposition: A | Discharge status: Home / Self Care | Payer: Self-pay | Attending: Emergency Medicine | Admitting: Emergency Medicine

## 2015-06-28 ENCOUNTER — Encounter (HOSPITAL_COMMUNITY): Payer: Self-pay | Admitting: Emergency Medicine

## 2015-06-28 DIAGNOSIS — R319 Hematuria, unspecified: Secondary | ICD-10-CM | POA: Insufficient documentation

## 2015-06-28 DIAGNOSIS — J029 Acute pharyngitis, unspecified: Secondary | ICD-10-CM | POA: Insufficient documentation

## 2015-06-28 DIAGNOSIS — R1084 Generalized abdominal pain: Secondary | ICD-10-CM | POA: Insufficient documentation

## 2015-06-28 DIAGNOSIS — Z720 Tobacco use: Secondary | ICD-10-CM | POA: Insufficient documentation

## 2015-06-28 HISTORY — DX: Opioid abuse, uncomplicated: F11.10

## 2015-06-28 LAB — URINALYSIS, ROUTINE W REFLEX MICROSCOPIC
Bilirubin Urine: NEGATIVE
Glucose, UA: NEGATIVE mg/dL
Hgb urine dipstick: NEGATIVE
Ketones, ur: NEGATIVE mg/dL
Leukocytes, UA: NEGATIVE
Nitrite: NEGATIVE
Protein, ur: NEGATIVE mg/dL
Specific Gravity, Urine: 1.022 (ref 1.005–1.030)
Urobilinogen, UA: 0.2 mg/dL (ref 0.0–1.0)
pH: 6.5 (ref 5.0–8.0)

## 2015-06-28 LAB — COMPREHENSIVE METABOLIC PANEL
ALT: 19 U/L (ref 17–63)
AST: 25 U/L (ref 15–41)
Albumin: 4.5 g/dL (ref 3.5–5.0)
Alkaline Phosphatase: 77 U/L (ref 38–126)
Anion gap: 7 (ref 5–15)
BUN: 15 mg/dL (ref 6–20)
CO2: 27 mmol/L (ref 22–32)
Calcium: 9.5 mg/dL (ref 8.9–10.3)
Chloride: 107 mmol/L (ref 101–111)
Creatinine, Ser: 0.92 mg/dL (ref 0.61–1.24)
GFR calc Af Amer: >60 mL/min (ref >60)
GFR calc non Af Amer: >60 mL/min (ref >60)
Glucose, Bld: 95 mg/dL (ref 65–99)
Potassium: 4.6 mmol/L (ref 3.5–5.1)
Sodium: 141 mmol/L (ref 135–145)
Total Bilirubin: 0.3 mg/dL (ref 0.3–1.2)
Total Protein: 7.3 g/dL (ref 6.5–8.1)

## 2015-06-28 LAB — CBC WITH DIFFERENTIAL/PLATELET
Basophils Absolute: 0.1 10*3/uL (ref 0.0–0.1)
Basophils Relative: 1 % (ref 0–1)
Eosinophils Absolute: 0.2 10*3/uL (ref 0.0–0.7)
Eosinophils Relative: 2 % (ref 0–5)
HCT: 45.2 % (ref 39.0–52.0)
Hemoglobin: 15.7 g/dL (ref 13.0–17.0)
Lymphocytes Relative: 44 % (ref 12–46)
Lymphs Abs: 4 10*3/uL (ref 0.7–4.0)
MCH: 31 pg (ref 26.0–34.0)
MCHC: 34.7 g/dL (ref 30.0–36.0)
MCV: 89.3 fL (ref 78.0–100.0)
Monocytes Absolute: 0.5 10*3/uL (ref 0.1–1.0)
Monocytes Relative: 6 % (ref 3–12)
Neutro Abs: 4.3 10*3/uL (ref 1.7–7.7)
Neutrophils Relative %: 47 % (ref 43–77)
Platelets: 263 10*3/uL (ref 150–400)
RBC: 5.06 MIL/uL (ref 4.22–5.81)
RDW: 12.8 % (ref 11.5–15.5)
WBC: 9.1 10*3/uL (ref 4.0–10.5)

## 2015-06-28 LAB — LIPASE, BLOOD: Lipase: 22 U/L (ref 22–51)

## 2015-06-28 LAB — RAPID HIV SCREEN (HIV 1/2 AB+AG)
HIV 1/2 Antibodies: NONREACTIVE
HIV-1 P24 Antigen - HIV24: NONREACTIVE

## 2015-06-28 MED ORDER — IOHEXOL 300 MG/ML  SOLN
50.0000 mL | Freq: Once | INTRAMUSCULAR | Status: AC | PRN
  Administered 2015-06-28: 50 mL via ORAL

## 2015-06-28 MED ORDER — IOHEXOL 300 MG/ML  SOLN
100.0000 mL | Freq: Once | INTRAMUSCULAR | Status: AC | PRN
  Administered 2015-06-28: 100 mL via INTRAVENOUS

## 2015-06-28 NOTE — ED Notes (Signed)
Patient transported to CT 

## 2015-06-28 NOTE — ED Notes (Signed)
Questions r/t dc were denied. Pt ambulatory and a&ox4 

## 2015-06-28 NOTE — ED Provider Notes (Signed)
CSN: [id number]     Arrival date & time 06/28/15  1122 History   First MD Initiated Contact with Patient 06/28/15 1134     Chief Complaint  Patient presents with  . Abdominal Pain  . Hematuria  . Sore Throat     (Consider location/radiation/quality/duration/timing/severity/associated sxs/prior Treatment) HPI Patient presents to the emergency department with with abdominal pain for the past week and a half.  He noticed some bloody urine this morning.  The patient states that he has also had a bowling glands for the last couple of months.  Patient states he is worried about many problems.  The patient denies chest pain, shortness of breath, headache, blurred vision, back pain, neck pain, fever or syncope.  The patient states that nothing seems to make his condition, better or worse.  He should states that he has not followed up with anybody.  He was hospitalized for heroin overdose several months ago Past Medical History  Diagnosis Date  . Heroin abuse    No past surgical history on file. No family history on file. History  Substance Use Topics  . Smoking status: Current Some Day Smoker -- 0.25 packs/day for 10 years    Types: Cigarettes  . Smokeless tobacco: Current User    Types: Chew  . Alcohol Use: Yes     Comment: 2 x week    Review of Systems  All other systems negative except as documented in the HPI. All pertinent positives and negatives as reviewed in the HPI.  Allergies  Review of patient's allergies indicates no known allergies.  Home Medications   Prior to Admission medications   Medication Sig Start Date End Date Taking? Authorizing Provider  ibuprofen (ADVIL,MOTRIN) 200 MG tablet Take 400 mg by mouth every 6 (six) hours as needed for headache (headache).    Yes Historical Provider, MD  amoxicillin-clavulanate (AUGMENTIN) 875-125 MG per tablet Take 1 tablet by mouth every 12 (twelve) hours. Patient not taking: Reported on 06/28/2015 03/14/15   Antony MaduraKelly Humes, PA-C   oxyCODONE-acetaminophen (PERCOCET/ROXICET) 5-325 MG per tablet Take 1-2 tablets by mouth every 6 (six) hours as needed for severe pain. Patient not taking: Reported on 06/28/2015 03/14/15   Antony MaduraKelly Humes, PA-C   BP 117/66 mmHg  Pulse 48  Temp(Src) 97.9 F (36.6 C) (Oral)  Resp 18  SpO2 100% Physical Exam  Constitutional: He is oriented to person, place, and time. He appears well-developed and well-nourished. No distress.  HENT:  Head: Normocephalic and atraumatic.  Mouth/Throat: Oropharynx is clear and moist.  Eyes: Pupils are equal, round, and reactive to light.  Neck: Normal range of motion. Neck supple. No tracheal deviation present. No thyromegaly present.  Cardiovascular: Normal rate, regular rhythm, normal heart sounds and intact distal pulses.  Exam reveals no gallop and no friction rub.   No murmur heard. Pulmonary/Chest: Effort normal and breath sounds normal. No stridor. No respiratory distress.  Abdominal: Soft. Bowel sounds are normal. He exhibits no distension. There is no tenderness. There is no rebound.  Lymphadenopathy:    He has no cervical adenopathy.  Neurological: He is alert and oriented to person, place, and time. He exhibits normal muscle tone. Coordination normal.  Skin: Skin is warm and dry. No rash noted. No erythema.  Psychiatric: He has a normal mood and affect. His behavior is normal.  Nursing note and vitals reviewed.   ED Course  Procedures (including critical care time) Labs Review Labs Reviewed  COMPREHENSIVE METABOLIC PANEL  LIPASE, BLOOD  CBC  WITH DIFFERENTIAL/PLATELET  RAPID HIV SCREEN (HIV 1/2 AB+AG)  URINALYSIS, ROUTINE W REFLEX MICROSCOPIC (NOT AT [facility])  HEPATITIS PANEL, ACUTE    Imaging Review Ct Abdomen Pelvis W Contrast  06/28/2015   CLINICAL DATA:  Ten day history of right lower quadrant pain. One day history of hematuria  EXAM: CT ABDOMEN AND PELVIS WITH CONTRAST  TECHNIQUE: Multidetector CT imaging of the abdomen and pelvis was  performed using the standard protocol following bolus administration of intravenous contrast. Oral contrast was also administered.  CONTRAST:  OMNIPAQUE IOHEXOL 300 MG/ML  SOLN  COMPARISON:  None.  FINDINGS: Lung bases are clear.  No focal liver lesions are identified. Gallbladder wall is not thickened. There is no biliary duct dilatation.  Spleen, pancreas, and adrenals appear normal. Kidneys bilaterally show no mass or hydronephrosis on either side. There is no renal or ureteral calculus on either side.  In the pelvis, the wall of the urinary bladder is slightly thickened in a generalized manner. Note that the bladder is not fully distended. There is no pelvic mass or pelvic fluid collection.  Appendix appears normal. There is no bowel wall or mesenteric thickening on this study.  There is no bowel obstruction. No free air or portal venous air. There is no appreciable ascites, adenopathy, or abscess in the abdomen or pelvis. There is no abdominal aortic aneurysm. There are no blastic or lytic bone lesions.  IMPRESSION: Appendix appears normal. No bowel wall or mesenteric thickening. No bowel obstruction. No abscess.  No renal or ureteral calculus.  No hydronephrosis.  Urinary bladder wall appears slightly thickened which in part may be due to incomplete distention of the urinary bladder. Given the CT appearance, however, it may be prudent to correlate with urinalysis with respect to potential cystitis.   Electronically Signed   By: Bretta Bang III M.D.   On: 06/28/2015 13:41     EKG Interpretation   Date/Time:  Tuesday June 28 2015 11:45:16 EDT Ventricular Rate:  49 PR Interval:  158 QRS Duration: 86 QT Interval:  402 QTC Calculation: 363 R Axis:   84 Text Interpretation:  Sinus bradycardia Multiple ventricular premature  complexes Abnormal ekg Confirmed by BEATON  MD, ROBERT ([id number]) on 06/28/2015  11:49:59 AM      Patient will be referred to primary care.  Told to return here as  needed.  I am going over his laboratory testing with the patient.  He understands the plan and all questions were answered    Charlestine Night, PA-C 06/28/15 1602  Lorre Nick, MD 06/30/15 [phone]

## 2015-06-28 NOTE — ED Notes (Signed)
Pt does not have to use restroom at this time.will info his nurse when can give urine sample.

## 2015-06-28 NOTE — ED Notes (Signed)
Pt info me he have been short breath

## 2015-06-28 NOTE — Discharge Instructions (Signed)
Return here as needed.  Follow-up with the clinic provided.  °

## 2015-06-28 NOTE — ED Notes (Signed)
Pt is asymptomatic, denies dizziness and weakness although HR 46. HR varies between 46-66

## 2015-06-28 NOTE — ED Notes (Signed)
Pt states that he has been having sore throat and swollen glands x a couple months.  Pt states that he has been having RLQ pain x a wk and a half.  Pt also states that he has had hematuria this morning.  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  Pt states he is now in recovery and has been 30 days clean.

## 2015-06-29 LAB — HEPATITIS PANEL, ACUTE
HCV Ab: <0.1 s/co ratio (ref 0.0–0.9)
Hep A IgM: NEGATIVE
Hep B C IgM: NEGATIVE
Hepatitis B Surface Ag: NEGATIVE

## 2015-08-18 ENCOUNTER — Emergency Department (HOSPITAL_COMMUNITY): Payer: Self-pay

## 2015-08-18 ENCOUNTER — Emergency Department (HOSPITAL_COMMUNITY)
Admission: EM | Admit: 2015-08-18 | Discharge: 2015-08-18 | Disposition: A | Discharge status: Home / Self Care | Payer: Self-pay | Attending: Emergency Medicine | Admitting: Emergency Medicine

## 2015-08-18 ENCOUNTER — Encounter (HOSPITAL_COMMUNITY): Payer: Self-pay | Admitting: Emergency Medicine

## 2015-08-18 DIAGNOSIS — Z720 Tobacco use: Secondary | ICD-10-CM | POA: Insufficient documentation

## 2015-08-18 DIAGNOSIS — M25512 Pain in left shoulder: Secondary | ICD-10-CM | POA: Insufficient documentation

## 2015-08-18 MED ORDER — IBUPROFEN 800 MG PO TABS: 800.0000 mg | ORAL_TABLET | Freq: Three times a day (TID) | ORAL | Status: DC

## 2015-08-18 MED ORDER — CYCLOBENZAPRINE HCL 10 MG PO TABS: 10.0000 mg | ORAL_TABLET | Freq: Two times a day (BID) | ORAL | Status: DC | PRN

## 2015-08-18 MED ORDER — IBUPROFEN 800 MG PO TABS
800.0000 mg | ORAL_TABLET | Freq: Once | ORAL | Status: AC
  Administered 2015-08-18: 800 mg via ORAL
  Filled 2015-08-18: qty 1

## 2015-08-18 NOTE — ED Notes (Signed)
Discharge instructions and prescriptions given and reviewed with patient.  Patient verbalized understanding of sedating effects of muscle relaxer and to follow up as needed.  Patient ambulatory; discharged home in good condition.

## 2015-08-18 NOTE — Discharge Instructions (Signed)
Musculoskeletal Pain Musculoskeletal pain is muscle and boney aches and pains. These pains can occur in any part of the body. Your caregiver may treat you without knowing the cause of the pain. They may treat you if blood or urine tests, X-rays, and other tests were normal.  CAUSES There is often not a definite cause or reason for these pains. These pains may be caused by a type of germ (virus). The discomfort may also come from overuse. Overuse includes working out too hard when your body is not fit. Boney aches also come from weather changes. Bone is sensitive to atmospheric pressure changes. HOME CARE INSTRUCTIONS   Ask when your test results will be ready. Make sure you get your test results.  Only take over-the-counter or prescription medicines for pain, discomfort, or fever as directed by your caregiver. If you were given medications for your condition, do not drive, operate machinery or power tools, or sign legal documents for 24 hours. Do not drink alcohol. Do not take sleeping pills or other medications that may interfere with treatment.  Continue all activities unless the activities cause more pain. When the pain lessens, slowly resume normal activities. Gradually increase the intensity and duration of the activities or exercise.  During periods of severe pain, bed rest may be helpful. Lay or sit in any position that is comfortable.  Putting ice on the injured area.  Put ice in a bag.  Place a towel between your skin and the bag.  Leave the ice on for 15 to 20 minutes, 3 to 4 times a day.  Follow up with your caregiver for continued problems and no reason can be found for the pain. If the pain becomes worse or does not go away, it may be necessary to repeat tests or do additional testing. Your caregiver may need to look further for a possible cause. SEEK IMMEDIATE MEDICAL CARE IF:  You have pain that is getting worse and is not relieved by medications.  You develop chest pain  that is associated with shortness or breath, sweating, feeling sick to your stomach (nauseous), or throw up (vomit).  Your pain becomes localized to the abdomen.  You develop any new symptoms that seem different or that concern you. MAKE SURE YOU:   Understand these instructions.  Will watch your condition.  Will get help right away if you are not doing well or get worse. Document Released: 12/10/2005 Document Revised: 03/03/2012 Document Reviewed: 08/14/2013 [facility] Patient Information 2015 [place], [place]. This information is not intended to replace advice given to you by your health care provider. Make sure you discuss any questions you have with your health care provider.   Heat Therapy Heat therapy can help make painful, stiff muscles and joints feel better. Do not use heat on new injuries. Wait at least 48 hours after an injury to use heat. Do not use heat when you have aches or pains right after an activity. If you still have pain 3 hours after stopping the activity, then you may use heat. HOME CARE Wet heat pack  Soak a clean towel in warm water. Squeeze out the extra water.  Put the warm, wet towel in a plastic bag.  Place a thin, dry towel between your skin and the bag.  Put the heat pack on the area for 5 minutes, and check your skin. Your skin may be pink, but it should not be red.  Leave the heat pack on the area for 15 to 30 minutes.  Repeat this every 2 to 4 hours while awake. Do not use heat while you are sleeping. Warm water bath  Fill a tub with warm water.  Place the affected body part in the tub.  Soak the area for 20 to 40 minutes.  Repeat as needed. Hot water bottle  Fill the water bottle half full with hot water.  Press out the extra air. Close the cap tightly.  Place a dry towel between your skin and the bottle.  Put the bottle on the area for 5 minutes, and check your skin. Your skin may be pink, but it should not be red.  Leave the bottle  on the area for 15 to 30 minutes.  Repeat this every 2 to 4 hours while awake. Electric heating pad  Place a dry towel between your skin and the heating pad.  Set the heating pad on low heat.  Put the heating pad on the area for 10 minutes, and check your skin. Your skin may be pink, but it should not be red.  Leave the heating pad on the area for 20 to 40 minutes.  Repeat this every 2 to 4 hours while awake.  Do not lie on the heating pad.  Do not fall asleep while using the heating pad.  Do not use the heating pad near water. GET HELP RIGHT AWAY IF:  You get blisters or red skin.  Your skin is puffy (swollen), or you lose feeling (numbness) in the affected area.  You have any new problems.  Your problems are getting worse.  You have any questions or concerns. If you have any problems, stop using heat therapy until you see your doctor. MAKE SURE YOU:  Understand these instructions.  Will watch your condition.  Will get help right away if you are not doing well or get worse. Document Released: 03/03/2012 Document Reviewed: 02/02/2014 [facility] Patient Information 2015 [place], [place]. This information is not intended to replace advice given to you by your health care provider. Make sure you discuss any questions you have with your health care provider.

## 2015-08-18 NOTE — ED Notes (Signed)
Patient says that the pain started two days ago. Patient is complaining of pain in the bottom of the back of the left shoulder/top of left scapula. Patient is complaining of tingling in the same spot. Patient took a bath to relieve pain. Patient states the pain is worse today.

## 2015-08-18 NOTE — ED Notes (Signed)
Bed: [facility] Expected date:  Expected time:  Means of arrival:  Comments: EMS 29 yo male left shoulder tingling

## 2015-08-18 NOTE — ED Provider Notes (Signed)
CSN: [id number]     Arrival date & time 08/18/15  4098 History   First MD Initiated Contact with Patient 08/18/15 [phone]     Chief Complaint  Patient presents with  . Shoulder Pain    Patient says that the pain started two days ago. Patient is complaining of pain in the bottom of the back of the left shoulder/top of left scapula. Patient is complaining of tingling in the same spot. Patient took a bath to relieve pain. Patient states the pain is worse today.      (Consider location/radiation/quality/duration/timing/severity/associated sxs/prior Treatment) HPI   Patient is a 29 year old male with past history of unintentional overdose with percocet and heroin x 6 mo ago and living in a recovery house who presents today with 1 day history of "annoying", aching constant left shoulder pain that radiates occasionally to the left deltoid and is alleviated by a warm shower and worse with movement.  Denies fever, CP, SOB, N/V/D, abdominal pain, numbness, weakness, rash, or history of trauma.  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  He also works out on a regular basis and did strenuous arm exercises yesterday.  He denies recent IV drug use within the past 4 weeks. He states he has been sober for 90 days.   Past Medical History  Diagnosis Date  . Heroin abuse    History reviewed. No pertinent past surgical history. History reviewed. No pertinent family history. Social History  Substance Use Topics  . Smoking status: Current Some Day Smoker -- 0.25 packs/day for 10 years    Types: Cigarettes  . Smokeless tobacco: Current User    Types: Chew  . Alcohol Use: Yes     Comment: 2 x week    Review of Systems All other systems negative unless otherwise stated in HPI.     Allergies  Review of patient's allergies indicates no known allergies.  Home Medications   Prior to Admission medications   Medication Sig Start Date End Date Taking?  Authorizing Provider  amoxicillin-clavulanate (AUGMENTIN) 875-125 MG per tablet Take 1 tablet by mouth every 12 (twelve) hours. Patient not taking: Reported on 06/28/2015 03/14/15   Antony Madura, PA-C  ibuprofen (ADVIL,MOTRIN) 200 MG tablet Take 400 mg by mouth every 6 (six) hours as needed for headache (headache).     Historical Provider, MD  oxyCODONE-acetaminophen (PERCOCET/ROXICET) 5-325 MG per tablet Take 1-2 tablets by mouth every 6 (six) hours as needed for severe pain. Patient not taking: Reported on 06/28/2015 03/14/15   Antony Madura, PA-C   BP 121/67 mmHg  Pulse 60  Temp(Src) 98.2 F (36.8 C) (Oral)  Resp 20  Ht 5\' 3"  (1.6 m)  Wt 139 lb (63.05 kg)  BMI 24.63 kg/m2  SpO2 100%   Physical Exam  Constitutional: He is oriented to person, place, and time. He appears well-developed and well-nourished.  HENT:  Head: Normocephalic and atraumatic.  Mouth/Throat: Oropharynx is clear and moist.  Neck: Normal range of motion.  Cardiovascular: Normal rate, regular rhythm and normal heart sounds.   No murmur heard. Pulmonary/Chest: Effort normal and breath sounds normal. No respiratory distress.  Abdominal: Soft. Bowel sounds are normal. He exhibits no distension. There is no tenderness.  Musculoskeletal: Normal range of motion.  No erythema, warmth, induration, or lesions.  No deformities.  Full range of motion of shoulders bilaterally.  5/5 strength upper extremities bilaterally with minimal elicitation of pain.  Neurological: He is  alert and oriented to person, place, and time.  Skin:  No evidence of track marks on hands, arms, feet, or legs. No rash, erythema, warmth, or wounds.   Psychiatric: He has a normal mood and affect. His behavior is normal.    ED Course  Procedures (including critical care time) Labs Review Labs Reviewed - No data to display  Imaging Review No results found. I have personally reviewed and evaluated these images and lab results as part of my medical  decision-making.   EKG Interpretation None      MDM   Final diagnoses:  Left shoulder pain   Patient is a 29 year old male with past history of unintentional overdose with percocet and heroin x 6 mo ago and living in a recovery house who presents today with 1 day history of "annoying", aching constant left shoulder pain that radiates occasionally to the left deltoid and is alleviated by a warm shower and worse with movement.  Denies fever, CP, SOB, N/V/D, abdominal pain, numbness, weakness, rash, or history of trauma.  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  He also works out on a regular basis and did strenuous arm exercises yesterday.  VSS.  On exam,  there is no erythema, warmth, induration, or lesions. He has full range of motion of shoulders bilaterally with 5/5 strength.  No evidence of track marks on upper or lower extremities.    Filed Vitals:   08/18/15 0711  BP: 121/67  Pulse: 60  Temp: 98.2 F (36.8 C)  Resp: 20    X-ray of the left shoulder showed no fracture.  Low suspicion for septic joint.  Most likely musculoskeletal pain for occupation and exercise regimen.  Patient discharged home with Flexeril and Ibuprofen and given return precautions.  Case has been discussed with and seen by Dr. Patria Mane who agrees with the above plan for discharge.     Cheri Fowler, [place] 08/18/15 1610  Azalia Bilis, MD 08/18/15 1101

## 2016-04-22 ENCOUNTER — Emergency Department (HOSPITAL_COMMUNITY)
Admission: EM | Admit: 2016-04-22 | Discharge: 2016-04-22 | Disposition: A | Discharge status: Home / Self Care | Payer: Self-pay | Attending: Emergency Medicine | Admitting: Emergency Medicine

## 2016-04-22 ENCOUNTER — Encounter (HOSPITAL_COMMUNITY): Payer: Self-pay | Admitting: Emergency Medicine

## 2016-04-22 DIAGNOSIS — F17210 Nicotine dependence, cigarettes, uncomplicated: Secondary | ICD-10-CM | POA: Insufficient documentation

## 2016-04-22 DIAGNOSIS — R002 Palpitations: Secondary | ICD-10-CM | POA: Insufficient documentation

## 2016-04-22 DIAGNOSIS — Z791 Long term (current) use of non-steroidal anti-inflammatories (NSAID): Secondary | ICD-10-CM | POA: Insufficient documentation

## 2016-04-22 DIAGNOSIS — Z79899 Other long term (current) drug therapy: Secondary | ICD-10-CM | POA: Insufficient documentation

## 2016-04-22 DIAGNOSIS — F1420 Cocaine dependence, uncomplicated: Secondary | ICD-10-CM | POA: Insufficient documentation

## 2016-04-22 DIAGNOSIS — F1920 Other psychoactive substance dependence, uncomplicated: Secondary | ICD-10-CM

## 2016-04-22 DIAGNOSIS — M545 Low back pain, unspecified: Secondary | ICD-10-CM

## 2016-04-22 DIAGNOSIS — Z79891 Long term (current) use of opiate analgesic: Secondary | ICD-10-CM | POA: Insufficient documentation

## 2016-04-22 DIAGNOSIS — Z792 Long term (current) use of antibiotics: Secondary | ICD-10-CM | POA: Insufficient documentation

## 2016-04-22 MED ORDER — CYCLOBENZAPRINE HCL 10 MG PO TABS: 10.0000 mg | ORAL_TABLET | Freq: Two times a day (BID) | ORAL | Status: DC | PRN

## 2016-04-22 MED ORDER — NAPROXEN 500 MG PO TABS: 500.0000 mg | ORAL_TABLET | Freq: Two times a day (BID) | ORAL | Status: DC

## 2016-04-22 NOTE — ED Provider Notes (Signed)
CSN: [id number]     Arrival date & time 04/22/16  [id number] History   First MD Initiated Contact with Patient 04/22/16 [id number]     Chief Complaint  Patient presents with  . Drug Problem     (Consider location/radiation/quality/duration/timing/severity/associated sxs/prior Treatment) HPI Patient did $100 was a crack over the past day. He reports that he came to the emergency department because he was having a racing heart. It has improved now. No shortness of breath or chest pain. No fever or cough. He also states that he had lower back pain. 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 No lower abdominal pain, no urinary pain, no blood in the urine, no radiation of pain into his legs. No difficulty walking. No weakness or numbness. Past Medical History  Diagnosis Date  . Heroin abuse    History reviewed. No pertinent past surgical history. History reviewed. No pertinent family history. Social History  Substance Use Topics  . Smoking status: Current Some Day Smoker -- 0.25 packs/day for 10 years    Types: Cigarettes  . Smokeless tobacco: Current User    Types: Chew  . Alcohol Use: Yes     Comment: 2 x week    Review of Systems 10 Systems reviewed and are negative for acute change except as noted in the HPI.    Allergies  Review of patient's allergies indicates no known allergies.  Home Medications   Prior to Admission medications   Medication Sig Start Date End Date Taking? Authorizing Provider  amoxicillin-clavulanate (AUGMENTIN) 875-125 MG per tablet Take 1 tablet by mouth every 12 (twelve) hours. Patient not taking: Reported on 06/28/2015 03/14/15   Antony MaduraKelly Humes, PA-C  cyclobenzaprine (FLEXERIL) 10 MG tablet Take 1 tablet (10 mg total) by mouth 2 (two) times daily as needed for muscle spasms. 08/18/15   Cheri FowlerKayla Rose, PA-C  cyclobenzaprine (FLEXERIL) 10 MG tablet Take 1 tablet (10 mg total) by mouth 2 (two) times daily as needed for muscle spasms. 04/22/16    Arby BarretteMarcy Parv Manthey, MD  ibuprofen (ADVIL,MOTRIN) 200 MG tablet Take 400 mg by mouth every 6 (six) hours as needed for headache (headache).     Historical Provider, MD  ibuprofen (ADVIL,MOTRIN) 800 MG tablet Take 1 tablet (800 mg total) by mouth 3 (three) times daily. 08/18/15   Cheri FowlerKayla Rose, PA-C  naproxen (NAPROSYN) 500 MG tablet Take 1 tablet (500 mg total) by mouth 2 (two) times daily. 04/22/16   Arby BarretteMarcy Richa Shor, MD  oxyCODONE-acetaminophen (PERCOCET/ROXICET) 5-325 MG per tablet Take 1-2 tablets by mouth every 6 (six) hours as needed for severe pain. Patient not taking: Reported on 06/28/2015 03/14/15   Antony MaduraKelly Humes, PA-C   BP 126/80 mmHg  Pulse 83  Temp(Src) 98 F (36.7 C) (Oral)  Resp 14  Ht 5\' 3"  (1.6 m)  Wt 142 lb (64.411 kg)  BMI 25.16 kg/m2  SpO2 97% Physical Exam  Constitutional: He is oriented to person, place, and time. He appears well-developed and well-nourished.  HENT:  Head: Normocephalic and atraumatic.  Nose: Nose normal.  Mouth/Throat: Oropharynx is clear and moist.  Eyes: EOM are normal. Pupils are equal, round, and reactive to light.  Neck: Neck supple.  Cardiovascular: Normal rate, regular rhythm, normal heart sounds and intact distal pulses.   Pulmonary/Chest: Effort normal and breath sounds normal.  Abdominal: Soft. Bowel sounds are normal. He exhibits no distension. There is no tenderness.  Musculoskeletal: Normal range of motion. He exhibits no edema or tenderness.  Normal  visual special the back. No CVA tenderness. No bony point tenderness. Patient endorses the location of his pain to be symmetric across the low back over the iliac wings.  Neurological: He is alert and oriented to person, place, and time. He has normal strength. No cranial nerve deficit. He exhibits normal muscle tone. Coordination normal. GCS eye subscore is 4. GCS verbal subscore is 5. GCS motor subscore is 6.  Skin: Skin is warm, dry and intact.  Psychiatric: He has a normal mood and affect.    ED  Course  Procedures (including critical care time) Labs Review Labs Reviewed - No data to display  Imaging Review No results found. I have personally reviewed and evaluated these images and lab results as part of my medical decision-making.   EKG Interpretation None      MDM   Final diagnoses:  Drug abuse and dependence (HCC)  Palpitations  Bilateral low back pain without sciatica   Patient reports using crack yesterday. His heart exam is normal. Heart rate is regular without tachycardia. No rub murmur gallop. Lungs are clear. Patient is well in appearance without respiratory distress. Blood pressures are normal. The patient's lower back pain appears to be uncomplicated musculoskeletal pain. She has been digging on a Holiday representative job. No weakness numbness or tingling. Legs do not have any peripheral edema. No rashes or joint swellings. Patient discharged in stable condition with counseling for seeking treatment for drug abuse and dependency.    Arby Barrette, MD 04/22/16 [phone]

## 2016-04-22 NOTE — Discharge Instructions (Signed)
Opioid Use Disorder °Opioid use disorder is a mental disorder. It is the continued nonmedical use of opioids in spite of risks to health and well-being. Misused opioids include the street drug heroin. They also include pain medicines such as morphine, hydrocodone, oxycodone, and fentanyl. Opioids are very addictive. People who misuse opioids get an exaggerated feeling of well-being. Opioid use disorder often disrupts activities at home, work, or school. It may cause mental or physical problems.  °A family history of opioid use disorder puts you at higher risk of it. People with opioid use disorder often misuse other drugs or have mental illness such as depression, posttraumatic stress disorder, or antisocial personality disorder. They also are at risk of suicide and death from overdose. °SIGNS AND SYMPTOMS  °Signs and symptoms of opioid use disorder include: °· Use of opioids in larger amounts or over a longer period than intended. °· Unsuccessful attempts to cut down or control opioid use. °· A lot of time spent obtaining, using, or recovering from the effects of opioids. °· A strong desire or urge to use opioids (craving). °· Continued use of opioids in spite of major problems at work, school, or home because of use. °· Continued use of opioids in spite of relationship problems because of use. °· Giving up or cutting down on important life activities because of opioid use. °· Use of opioids over and over in situations when it is physically hazardous, such as driving a car. °· Continued use of opioids in spite of a physical problem that is likely related to use. Physical problems can include: °· Severe constipation. °· Poor nutrition. °· Infertility. °· Tuberculosis. °· Aspiration pneumonia. °· Infections such as human immunodeficiency virus (HIV) and hepatitis (from injecting opioids). °· Continued use of opioids in spite of a mental problem that is likely related to use. Mental problems can  include: °· Depression. °· Anxiety. °· Hallucinations. °· Sleep problems. °· Loss of sexual function. °· Need to use more and more opioids to get the same effect, or lessened effect over time with use of the same amount (tolerance). °· Having withdrawal symptoms when opioid use is stopped, or using opioids to reduce or avoid withdrawal symptoms. Withdrawal symptoms include: °· Depressed, anxious, or irritable mood. °· Nausea, vomiting, diarrhea, or intestinal cramping. °· Muscle aches or spasms. °· Excessive tearing or runny nose. °· Dilated pupils, sweating, or hairs standing on end. °· Yawning. °· Fever, raised blood pressure, or fast pulse. °· Restlessness or trouble sleeping. This does not apply to people taking opioids for medical reasons only. °DIAGNOSIS °Opioid use disorder is diagnosed by your health care provider. You may be asked questions about your opioid use and and how it affects your life. A physical exam may be done. A drug screen may be ordered. You may be referred to a mental health professional. The diagnosis of opioid use disorder requires at least two symptoms within 12 months. The type of opioid use disorder you have depends on the number of signs and symptoms you have. The type may be: °· Mild. Two or three signs and symptoms.    °· Moderate. Four or five signs and symptoms.   °· Severe. Six or more signs and symptoms. °TREATMENT  °Treatment is usually provided by mental health professionals with training in substance use disorders. The following options are available: °· Detoxification. This is the first step in treatment for withdrawal. It is medically supervised withdrawal with the use of medicines. These medicines lessen withdrawal symptoms. They also raise the chance   of becoming opioid free.  Counseling, also known as talk therapy. Talk therapy addresses the reasons you use opioids. It also addresses ways to keep you from using again (relapse). The goals of talk therapy are to avoid  relapse by:  Identifying and avoiding triggers for use.  Finding healthy ways to cope with stress.  Learning how to handle cravings.  Support groups. Support groups provide emotional support, advice, and guidance.  A medicine that blocks opioid receptors in your brain. This medicine can reduce opioid cravings that lead to relapse. This medicine also blocks the desired opioid effect when relapse occurs.  Opioids that are taken by mouth in place of the misused opioid (opioid maintenance treatment). These medicines satisfy cravings but are safer than commonly misused opioids. This often is the best option for people who continue to relapse with other treatments. HOME CARE INSTRUCTIONS   Take medicines only as directed by your health care provider.  Check with your health care provider before starting new medicines.  Keep all follow-up visits as directed by your health care provider. SEEK MEDICAL CARE IF:  You are not able to take your medicines as directed.  Your symptoms get worse. SEEK IMMEDIATE MEDICAL CARE IF:  You have serious thoughts about hurting yourself or others.  You may have taken an overdose of opioids. FOR MORE INFORMATION  National Institute on Drug Abuse: http://www.price-smith.com/  Substance Abuse and Mental Health Services Administration: SkateOasis.com.pt   This information is not intended to replace advice given to you by your health care provider. Make sure you discuss any questions you have with your health care provider.   Document Released: 10/07/2007 Document Revised: 12/31/2014 Document Reviewed: 12/23/2013 Elsevier Interactive Patient Education 2016 Elsevier Inc. Back Pain, Adult Back pain is very common in adults.The cause of back pain is rarely dangerous and the pain often gets better over time.The cause of your back pain may not be known. Some common causes of back pain include:  Strain of the muscles or ligaments supporting the spine.  Wear and tear  (degeneration) of the spinal disks.  Arthritis.  Direct injury to the back. For many people, back pain may return. Since back pain is rarely dangerous, most people can learn to manage this condition on their own. HOME CARE INSTRUCTIONS Watch your back pain for any changes. The following actions may help to lessen any discomfort you are feeling:  Remain active. It is stressful on your back to sit or stand in one place for long periods of time. Do not sit, drive, or stand in one place for more than 30 minutes at a time. Take short walks on even surfaces as soon as you are able.Try to increase the length of time you walk each day.  Exercise regularly as directed by your health care provider. Exercise helps your back heal faster. It also helps avoid future injury by keeping your muscles strong and flexible.  Do not stay in bed.Resting more than 1-2 days can delay your recovery.  Pay attention to your body when you bend and lift. The most comfortable positions are those that put less stress on your recovering back. Always use proper lifting techniques, including:  Bending your knees.  Keeping the load close to your body.  Avoiding twisting.  Find a comfortable position to sleep. Use a firm mattress and lie on your side with your knees slightly bent. If you lie on your back, put a pillow under your knees.  Avoid feeling anxious or stressed.Stress increases  muscle tension and can worsen back pain.It is important to recognize when you are anxious or stressed and learn ways to manage it, such as with exercise.  Take medicines only as directed by your health care provider. Over-the-counter medicines to reduce pain and inflammation are often the most helpful.Your health care provider may prescribe muscle relaxant drugs.These medicines help dull your pain so you can more quickly return to your normal activities and healthy exercise.  Apply ice to the injured area:  Put ice in a plastic  bag.  Place a towel between your skin and the bag.  Leave the ice on for 20 minutes, 2-3 times a day for the first 2-3 days. After that, ice and heat may be alternated to reduce pain and spasms.  Maintain a healthy weight. Excess weight puts extra stress on your back and makes it difficult to maintain good posture. SEEK MEDICAL CARE IF:  You have pain that is not relieved with rest or medicine.  You have increasing pain going down into the legs or buttocks.  You have pain that does not improve in one week.  You have night pain.  You lose weight.  You have a fever or chills. SEEK IMMEDIATE MEDICAL CARE IF:   You develop new bowel or bladder control problems.  You have unusual weakness or numbness in your arms or legs.  You develop nausea or vomiting.  You develop abdominal pain.  You feel faint.   This information is not intended to replace advice given to you by your health care provider. Make sure you discuss any questions you have with your health care provider.   Document Released: 12/10/2005 Document Revised: 12/31/2014 Document Reviewed: 04/13/2014 Elsevier Interactive Patient Education 2016 [employer]. [employer] Guide Outpatient Counseling/Substance Abuse Adult The United Ways 211 is a great source of information about community services available.  Access by dialing 2-1-1 from anywhere in [place], or by website -  PooledIncome.pl.   Other Local Resources (Updated 12/2015)  Crisis Hotlines   Services     Area Served  [employer]  Crisis Hotline, available 24 hours a day, 7 days a week: 361-533-2740 [facility], [place]   [facility]  Crisis Hotline, available 24 hours a day, 7 days a week: 947-386-2492 [facility], [place]  [facility]  Suicide Prevention Hotline, available 24 hours a day, 7 days a week: 318-506-2999 [facility], [place]  [employer], available 24 hours a day, 7 days  a week: ([phone] [facility] [place], [place]   [facility] Access to [employer], available 24 hours a day, 7 days a week: 952-075-2965 All   [facility]  Crisis Hotline, available 24 hours a day, 7 days a week: (727)070-6377 All   Other Local Resources (Updated 12/2015)  Outpatient Counseling/ Substance Abuse Programs  Services     Address and Phone Number  [facility] ([facility])   Options include Individual counseling, group counseling, intensive outpatient program (several hours a day, several days a week)  Offers depression assessments  Provides methadone maintenance program [phone] [address] [place], [place] [address]   [facility]   Offers partial hospitalization/day treatment and DUI/DWI programs  [employer], private insurance [phone] [address] [place], [place] [address]  [facility]    Services include intensive outpatient program (several hours a day, several days a week), outpatient treatment, DUI/DWI services, family education  Also has some services specifically  for [employer] transitional housing  [phone] [address] [place], [place] [address]     [facility]  Accepts Medicare, private pay, and private insurance [phone] [address] [place] [place], [place] [address]  [employer] [facility]  Services include individual counseling, substance abuse intensive outpatient program (several hours a day, several days a week), day treatment  Delene Loll, Medicaid, private insurance [phone] [address] [place], [place] [address]  [facility]   Offers substance abuse intensive outpatient program (several hours a day, several days a week), partial hospitalization program [phone] [address] [place], [place] [address]  [phone] [address] [place], [place] [address]  [phone] [address] [place], [place] [address]  [phone] [place], [place] [address]  [facility]  Individual counseling only  Accepts private insurance only [phone] [address] [place], [place] [address]  [facility]  Methadone maintenance program [phone] [address] [place], [place] [address]  [facility]  Walk-In Clinic providing substance abuse and mental health counseling  Accepts Medicaid, Medicare, private insurance  Offers sliding scale for uninsured [phone] [address] [place], [place]   [facility] [employer].  Offers individual counseling, and intensive in-home services [phone] [address] [place], [place] [address]  [facility] individual counseling, family counseling, group therapy, domestic violence counseling, consumer credit counseling  Accepts Medicare, Medicaid, private insurance  Offers sliding scale for uninsured [phone] [address] [place], [place] [address]  [phone] [facility], [address] [place], [place] [address]  [facility]  Offers individual, family and group counseling  3 locations - [place], [place], and [place]  [phone]  [address] [place], [place] [address]  [address] [place], [place] [address]  [address] [place], [place] [address]  [facility]    Offers psychiatric assessment, 8-week Intensive Outpatient Program (several hours a day, several times a week, daytime or evenings), early recovery group, family Program, medication management  Private pay or private insurance only [phone], or  [phone] [address] [place], [place] [address]  [facility] [employer] individual, couples and family counseling  Accepts Medicaid, private insurance, and sliding scale for uninsured  [phone] [address] [place], [place] [address]  Len Blalock, MD  Individual counseling  Private insurance [phone] [address] [place], [place] [address]  [facility]   Offers assessment, substance abuse treatment, and behavioral health treatment [phone] [address] [place], [place] [address]  [facility]  Individual counseling  Accepts private insurance [phone] [address] [place], [place] [address]  [facility]  Individual counseling  Delene Loll, private insurance [phone] [address] [place], [place] [address]  [facility]    Offers intensive outpatient program (several hours a day, several times a week)  Private pay, private insurance [phone] [facility] [place], [place]  [facility]  Individual counseling  Medicare, private insurance [phone] [address] [place], [place] [address]  [facility]    Offers intensive outpatient program (several hours a day, several times a week) and partial hospitalization program [phone] [address] [place], [place] [address]  Emerson Monte, MD  Individual counseling [phone] [address] [place], [place] [address]  [facility]  Offers Christian counseling to individuals, couples, and families  Accepts Medicare and private insurance; offers sliding scale for uninsured [phone] [address] [place], [place] [address]  [facility] [phone] [address], [address] [place], [place] [address]  [facility]   Offers crisis counseling, individual counseling, group therapy, in-home therapy, domestic violence services, day treatment, DWI services, Administrator, artsCommunity Support Team (CST), Doctor, hospitalAssertive Community Treatment Team (ACTT), substance  abuse Intensive Outpatient Program (several hours a day, several times a week)  2 locations - [place] [phone] [address] [place], [place] [address]  [phone] [address] [place], [place] [address]  [facility]     Individual counseling and group therapy  [employer] private insurance, [place], [place] [phone] [address] [place], [place]  [facility]  Offers individual and family counseling  Offers LGBTQ services  Accepts private insurance and private pay [phone] [address] [place], [place] [address]  [facility]    Offers individual counseling, group therapy, and outpatient detox  Accepts private insurance [phone] [address] [place], [place]  [facility]  Individual counseling  Accepts Medicare, private insurance [phone] [address] [place], [place] [address]  [facility]  Individual counseling  Accepts Medicare, private insurance [phone] [address] [place], [place] [address]  [facility]   Offers substance abuse Intensive Outpatient Program (several hours a day, several times a week) [phone], or [phone] [place], [place]

## 2016-04-22 NOTE — ED Notes (Signed)
Bed: ZO10WA11 Expected date:  Expected time:  Means of arrival:  Comments: EMS relapsed crack

## 2016-04-22 NOTE — ED Notes (Signed)
Per EMS pt reported to have used crack appx 1 hour ago and now reporting lower back pain. Pt states that he last used 34 days prior to today's use. And used appx $100 worth of crack today. Pt was ambulatory from EMS.

## 2016-05-15 ENCOUNTER — Emergency Department (HOSPITAL_COMMUNITY)
Admission: EM | Admit: 2016-05-15 | Discharge: 2016-05-15 | Disposition: A | Discharge status: Home / Self Care | Payer: Self-pay | Attending: Emergency Medicine | Admitting: Emergency Medicine

## 2016-05-15 ENCOUNTER — Encounter (HOSPITAL_COMMUNITY): Payer: Self-pay | Admitting: *Deleted

## 2016-05-15 DIAGNOSIS — Y990 Civilian activity done for income or pay: Secondary | ICD-10-CM | POA: Insufficient documentation

## 2016-05-15 DIAGNOSIS — K625 Hemorrhage of anus and rectum: Secondary | ICD-10-CM | POA: Insufficient documentation

## 2016-05-15 DIAGNOSIS — X509XXA Other and unspecified overexertion or strenuous movements or postures, initial encounter: Secondary | ICD-10-CM | POA: Insufficient documentation

## 2016-05-15 DIAGNOSIS — Y929 Unspecified place or not applicable: Secondary | ICD-10-CM | POA: Insufficient documentation

## 2016-05-15 DIAGNOSIS — Y939 Activity, unspecified: Secondary | ICD-10-CM | POA: Insufficient documentation

## 2016-05-15 DIAGNOSIS — M67441 Ganglion, right hand: Secondary | ICD-10-CM | POA: Insufficient documentation

## 2016-05-15 DIAGNOSIS — Z791 Long term (current) use of non-steroidal anti-inflammatories (NSAID): Secondary | ICD-10-CM | POA: Insufficient documentation

## 2016-05-15 DIAGNOSIS — F17210 Nicotine dependence, cigarettes, uncomplicated: Secondary | ICD-10-CM | POA: Insufficient documentation

## 2016-05-15 DIAGNOSIS — Z79899 Other long term (current) drug therapy: Secondary | ICD-10-CM | POA: Insufficient documentation

## 2016-05-15 LAB — CBC
HEMATOCRIT: 45 % (ref 39.0–52.0)
HEMOGLOBIN: 16.1 g/dL (ref 13.0–17.0)
MCH: 31 pg (ref 26.0–34.0)
MCHC: 35.8 g/dL (ref 30.0–36.0)
MCV: 86.5 fL (ref 78.0–100.0)
Platelets: 255 10*3/uL (ref 150–400)
RBC: 5.2 MIL/uL (ref 4.22–5.81)
RDW: 12.8 % (ref 11.5–15.5)
WBC: 8 10*3/uL (ref 4.0–10.5)

## 2016-05-15 LAB — COMPREHENSIVE METABOLIC PANEL
ALBUMIN: 4.5 g/dL (ref 3.5–5.0)
ALK PHOS: 69 U/L (ref 38–126)
ALT: 17 U/L (ref 17–63)
ANION GAP: 5 (ref 5–15)
AST: 25 U/L (ref 15–41)
BUN: 10 mg/dL (ref 6–20)
CALCIUM: 9.1 mg/dL (ref 8.9–10.3)
CHLORIDE: 103 mmol/L (ref 101–111)
CO2: 29 mmol/L (ref 22–32)
Creatinine, Ser: 1 mg/dL (ref 0.61–1.24)
GFR calc Af Amer: >60 mL/min (ref >60)
GFR calc non Af Amer: >60 mL/min (ref >60)
GLUCOSE: 90 mg/dL (ref 65–99)
Potassium: 3.9 mmol/L (ref 3.5–5.1)
SODIUM: 137 mmol/L (ref 135–145)
Total Bilirubin: 0.5 mg/dL (ref 0.3–1.2)
Total Protein: 7.3 g/dL (ref 6.5–8.1)

## 2016-05-15 LAB — TYPE AND SCREEN
ABO/RH(D): O NEG
Antibody Screen: NEGATIVE

## 2016-05-15 LAB — ABO/RH: ABO/RH(D): O NEG

## 2016-05-15 NOTE — ED Provider Notes (Signed)
CSN: [id number]     Arrival date & time 05/15/16  0935 History   First MD Initiated Contact with Patient 05/15/16 1136     Chief Complaint  Patient presents with  . Rectal Bleeding  . cyst in R hand       HPI Reports cyst in base of right index finger for 4-6 months. Reports more painful lately. No fever or chills or redness.  Pain is moderate in severity today. Hx of heroin abuse. Also reports intermittent blood noted on the tissue when having bowel movements over the past 3 weeks. No weakness, lightheadedness, or syncope   Past Medical History  Diagnosis Date  . Heroin abuse    Past Surgical History  Procedure Laterality Date  . Fracture surgery      nose   No family history on file. Social History  Substance Use Topics  . Smoking status: Current Some Day Smoker -- 0.25 packs/day for 10 years    Types: Cigarettes  . Smokeless tobacco: Current User    Types: Chew  . Alcohol Use: Yes    Review of Systems  All other systems reviewed and are negative.     Allergies  Review of patient's allergies indicates no known allergies.  Home Medications   Prior to Admission medications   Medication Sig Start Date End Date Taking? Authorizing Provider  diphenhydramine-acetaminophen (TYLENOL PM) 25-500 MG TABS tablet Take 1 tablet by mouth at bedtime as needed (for pain and sleep).   Yes Historical Provider, MD  cyclobenzaprine (FLEXERIL) 10 MG tablet Take 1 tablet (10 mg total) by mouth 2 (two) times daily as needed for muscle spasms. Patient not taking: Reported on 05/15/2016 04/22/16   Arby Barrette, MD  naproxen (NAPROSYN) 500 MG tablet Take 1 tablet (500 mg total) by mouth 2 (two) times daily. Patient not taking: Reported on 05/15/2016 04/22/16   Arby Barrette, MD   BP 117/88 mmHg  Pulse 72  Temp(Src) 98.3 F (36.8 C) (Oral)  Resp 16  Ht  (1.6 m)  Wt 142 lb (64.411 kg)  BMI 25.16 kg/m2  SpO2 100% Physical Exam  Constitutional: He is oriented to person, place,  and time. He appears well-developed and well-nourished.  HENT:  Head: Normocephalic.  Eyes: EOM are normal.  Neck: Normal range of motion.  Pulmonary/Chest: Effort normal.  Abdominal: He exhibits no distension.  Musculoskeletal: Normal range of motion.  Small cystic structure noted at the base of the right index finger along the flexor tendon overlying the proximal phalanx.  No erythema or swelling of the right index finger.  Normal flexion of the right index finger  Neurological: He is alert and oriented to person, place, and time.  Psychiatric: He has a normal mood and affect.  Nursing note and vitals reviewed.   ED Course  Procedures (including critical care time) Labs Review Labs Reviewed  COMPREHENSIVE METABOLIC PANEL  CBC  POC OCCULT BLOOD, ED  TYPE AND SCREEN  ABO/RH    Imaging Review No results found. I have personally reviewed and evaluated these images and lab results as part of my medical decision-making.   EKG Interpretation None      MDM   Final diagnoses:  Rectal bleeding  Ganglion cyst of flexor tendon sheath of finger of right hand    Likely ganglion cyst of the flexor tendon of the right index finger.  Phone number of the hand surgeon was given that the patient prefers to see a Hydrographic surveyor.  No indication  for emergent treatment today.  In regards to his primary blood per rectum this is likely hemorrhoidal in nature especially given his ongoing opioid abuse.  Discharge home in good condition.  Hemoglobin 16.  Vital signs are normal.  No indication for additional workup.    Azalia BilisKevin Rockney Grenz, MD 05/15/16 1245

## 2016-05-15 NOTE — ED Notes (Signed)
MD at bedside. 

## 2016-05-15 NOTE — Discharge Instructions (Signed)
Ganglion Cyst  A ganglion cyst is a noncancerous, fluid-filled lump that occurs near joints or tendons. The ganglion cyst grows out of a joint or the lining of a tendon. It most often develops in the hand or wrist, but it can also develop in the shoulder, elbow, hip, knee, ankle, or foot. The round or oval ganglion cyst can be the size of a pea or larger than a grape. Increased activity may enlarge the size of the cyst because more fluid starts to build up.   CAUSES  It is not known what causes a ganglion cyst to grow. However, it may be related to:  · Inflammation or irritation around the joint.  · An injury.  · Repetitive movements or overuse.  · Arthritis.  RISK FACTORS  Risk factors include:  · Being a woman.  · Being age 20-50.  SIGNS AND SYMPTOMS  Symptoms may include:   · A lump. This most often appears on the hand or wrist, but it can occur in other areas of the body.  · Tingling.  · Pain.  · Numbness.  · Muscle weakness.  · Weak grip.  · Less movement in a joint.  DIAGNOSIS  Ganglion cysts are most often diagnosed based on a physical exam. Your health care provider will feel the lump and may shine a light alongside it. If it is a ganglion cyst, a light often shines through it. Your health care provider may order an X-ray, ultrasound, or MRI to rule out other conditions.  TREATMENT  Ganglion cysts usually go away on their own without treatment. If pain or other symptoms are involved, treatment may be needed. Treatment is also needed if the ganglion cyst limits your movement or if it gets infected. Treatment may include:  · Wearing a brace or splint on your wrist or finger.  · Taking anti-inflammatory medicine.  · Draining fluid from the lump with a needle (aspiration).  · Injecting a steroid into the joint.  · Surgery to remove the ganglion cyst.  HOME CARE INSTRUCTIONS  · Do not press on the ganglion cyst, poke it with a needle, or hit it.  · Take medicines only as directed by your health care  provider.  · Wear your brace or splint as directed by your health care provider.  · Watch your ganglion cyst for any changes.  · Keep all follow-up visits as directed by your health care provider. This is important.  SEEK MEDICAL CARE IF:  · Your ganglion cyst becomes larger or more painful.  · You have increased redness, red streaks, or swelling.  · You have pus coming from the lump.  · You have weakness or numbness in the affected area.  · You have a fever or chills.     This information is not intended to replace advice given to you by your health care provider. Make sure you discuss any questions you have with your health care provider.     Document Released: 12/07/2000 Document Revised: 12/31/2014 Document Reviewed: 05/25/2014  Elsevier Interactive Patient Education ©2016 [employer]

## 2016-05-15 NOTE — ED Notes (Signed)
Pt reports "cyst" in R index finger which he's been seen for x 4 months ago, states he was instructed to return to the ED if it gets worse.  Pt reports he was told that the cyst is where the nerve is and would cause sharp shooting pain when using his R hand.  He states he was at work this am and picked up a shovel and had a severe pain from it.  Reports he was told 4 months ago that cyst might need to be removed.  Pt also reports bright red blood in his stool 3 weeks.  States noticing the blood when wiping.  Denies hemorrhoids at this time.

## 2016-12-29 ENCOUNTER — Emergency Department (HOSPITAL_COMMUNITY)
Admission: EM | Admit: 2016-12-29 | Discharge: 2016-12-29 | Disposition: A | Discharge status: Home / Self Care | Payer: Self-pay | Attending: Emergency Medicine | Admitting: Emergency Medicine

## 2016-12-29 ENCOUNTER — Encounter (HOSPITAL_COMMUNITY): Payer: Self-pay | Admitting: Emergency Medicine

## 2016-12-29 ENCOUNTER — Emergency Department (HOSPITAL_COMMUNITY): Payer: Self-pay

## 2016-12-29 DIAGNOSIS — M79672 Pain in left foot: Secondary | ICD-10-CM | POA: Insufficient documentation

## 2016-12-29 DIAGNOSIS — F17210 Nicotine dependence, cigarettes, uncomplicated: Secondary | ICD-10-CM | POA: Insufficient documentation

## 2016-12-29 DIAGNOSIS — Z79899 Other long term (current) drug therapy: Secondary | ICD-10-CM | POA: Insufficient documentation

## 2016-12-29 MED ORDER — NAPROXEN 500 MG PO TABS: 500.0000 mg | ORAL_TABLET | Freq: Two times a day (BID) | ORAL | 0 refills | Status: AC

## 2016-12-29 MED ORDER — IBUPROFEN 800 MG PO TABS
800.0000 mg | ORAL_TABLET | Freq: Once | ORAL | Status: AC
  Administered 2016-12-29: 800 mg via ORAL
  Filled 2016-12-29: qty 1

## 2016-12-29 NOTE — ED Notes (Signed)
Bed: WA21 Expected date:  Expected time:  Means of arrival:  Comments: 

## 2016-12-29 NOTE — ED Triage Notes (Signed)
Patient complaining Left heel is hurting. Patient states he cant get comfortable to sleep. Patient states he walks a lot but has not had any injury to his foot as he know of.

## 2016-12-29 NOTE — ED Provider Notes (Signed)
[facility]-EMERGENCY DEPT Provider Note   CSN: [id number] Arrival date & time: 12/29/16  0453     History   Chief Complaint Chief Complaint  Patient presents with  . Foot Pain    HPI Jason Wilcox is a 31 y.o. male.  Jason Wilcox is a 31 y.o. Male who presents to the ED complaining of left heel pain since last night. He denies any injury or trauma to his foot. He reports his pain began last night and has worsened. He reports it's difficult to ambulate. He reports his pain is focused around his heel. He denies hearing any popping or clicking. He denies any pain to his ankle or his knee. He has taken nothing for treatment for his symptoms today. He denies history of similar problems.  He denies fevers, knee pain, falls, numbness, tingling or weakness.    The history is provided by the patient and medical records. No language interpreter was used.  Foot Pain     Past Medical History:  Diagnosis Date  . Heroin abuse     There are no active problems to display for this patient.   Past Surgical History:  Procedure Laterality Date  . FRACTURE SURGERY     nose       Home Medications    Prior to Admission medications   Medication Sig Start Date End Date Taking? Authorizing Provider  cyclobenzaprine (FLEXERIL) 10 MG tablet Take 1 tablet (10 mg total) by mouth 2 (two) times daily as needed for muscle spasms. Patient not taking: Reported on 05/15/2016 04/22/16   Arby Barrette, MD  diphenhydramine-acetaminophen (TYLENOL PM) 25-500 MG TABS tablet Take 1 tablet by mouth at bedtime as needed (for pain and sleep).    Historical Provider, MD  naproxen (NAPROSYN) 500 MG tablet Take 1 tablet (500 mg total) by mouth 2 (two) times daily with a meal. 12/29/16   Everlene Farrier, PA-C    Family History History reviewed. No pertinent family history.  Social History Social History  Substance Use Topics  . Smoking status: Current Some Day Smoker    Packs/day: 0.25    Years: 10.00   Types: Cigarettes  . Smokeless tobacco: Current User    Types: Chew  . Alcohol use Yes     Allergies   Patient has no known allergies.   Review of Systems Review of Systems  Constitutional: Negative for fever.  Cardiovascular: Negative for leg swelling.  Musculoskeletal: Positive for arthralgias.  Skin: Negative for rash and wound.  Neurological: Negative for weakness and numbness.     Physical Exam Updated Vital Signs BP 114/68 (BP Location: Left Arm)   Pulse 81   Temp 97.5 F (36.4 C) (Oral)   Resp 16   Ht 5\' 3"  (1.6 m)   Wt 63.5 kg   SpO2 96%   BMI 24.80 kg/m   Physical Exam  Constitutional: He appears well-developed and well-nourished. No distress.  HENT:  Head: Normocephalic and atraumatic.  Eyes: Right eye exhibits no discharge. Left eye exhibits no discharge.  Cardiovascular: Normal rate, regular rhythm and intact distal pulses.   Bilateral dorsalis pedis and posterior tibialis pulses are intact. Good capillary refill to his bilateral distal toes.  Pulmonary/Chest: Effort normal. No respiratory distress.  Musculoskeletal: Normal range of motion. He exhibits tenderness. He exhibits no edema or deformity.  Mild tenderness to the patient's plantar aspect of his left heel. No overlying skin changes. No tenderness to his Achilles tendon. Negative Thompson sign. No calf edema or  tenderness. No tenderness to his right knee. No ecchymosis. No deformity.  Neurological: He is alert. No sensory deficit. Coordination normal.  Sensation is intact in bilateral distal toes.  Skin: Skin is warm and dry. Capillary refill takes less than 2 seconds. No rash noted. He is not diaphoretic. No erythema. No pallor.  Psychiatric: He has a normal mood and affect. His behavior is normal.  Nursing note and vitals reviewed.    ED Treatments / Results  Labs (all labs ordered are listed, but only abnormal results are displayed) Labs Reviewed - No data to display  EKG  EKG  Interpretation None       Radiology Dg Foot Complete Left  Result Date: 12/29/2016 CLINICAL DATA:  31 y/o M; pain to the left foot and toes worsening over the last few hours. Unable to bear weight. No known injury. EXAM: LEFT FOOT - COMPLETE 3+ VIEW COMPARISON:  None. FINDINGS: There is no evidence of fracture or dislocation. There is no evidence of arthropathy or other focal bone abnormality. Soft tissues are unremarkable. IMPRESSION: Negative. Electronically Signed   By: Mitzi HansenLance  Furusawa-Stratton M.D.   On: 12/29/2016 06:26    Procedures Procedures (including critical care time)  Medications Ordered in ED Medications  ibuprofen (ADVIL,MOTRIN) tablet 800 mg (not administered)     Initial Impression / Assessment and Plan / ED Course  I have reviewed the triage vital signs and the nursing notes.  Pertinent labs & imaging results that were available during my care of the patient were reviewed by me and considered in my medical decision making (see chart for details).  Clinical Course    This is a 31 y.o. Male who presents to the ED complaining of left heel pain since last night. He denies any injury or trauma to his foot. He reports his pain began last night and has worsened. He reports it's difficult to ambulate. He reports his pain is focused around his heel. He denies hearing any popping or clicking. He denies any pain to his ankle or his knee. He denies any pain to his plantar fascia. On exam patient is afebrile nontoxic appearing. He is neurovascularly intact. He has tenderness to the plantar aspect of his left heel. No overlying skin changes. No deformity or ecchymosis. X-rays unremarkable. Will provide with crutches and have him follow-up with orthopedic surgery. NSAIDs and ice for treatment. I advised the patient to follow-up with their primary care provider this week. I advised the patient to return to the emergency department with new or worsening symptoms or new concerns. The  patient verbalized understanding and agreement with plan.    Final Clinical Impressions(s) / ED Diagnoses   Final diagnoses:  Pain of left heel    New Prescriptions New Prescriptions   NAPROXEN (NAPROSYN) 500 MG TABLET    Take 1 tablet (500 mg total) by mouth 2 (two) times daily with a meal.     Everlene FarrierWilliam Karess Harner, PA-C 12/29/16 [id number]    Shaune Pollackameron Isaacs, MD 12/31/16 [phone]

## 2017-06-12 ENCOUNTER — Emergency Department (HOSPITAL_COMMUNITY)
Admission: EM | Admit: 2017-06-12 | Discharge: 2017-06-13 | Disposition: A | Discharge status: Home / Self Care | Payer: Self-pay | Attending: Emergency Medicine | Admitting: Emergency Medicine

## 2017-06-12 DIAGNOSIS — E876 Hypokalemia: Secondary | ICD-10-CM | POA: Insufficient documentation

## 2017-06-12 DIAGNOSIS — T50912A Poisoning by multiple unspecified drugs, medicaments and biological substances, intentional self-harm, initial encounter: Secondary | ICD-10-CM

## 2017-06-12 DIAGNOSIS — F10920 Alcohol use, unspecified with intoxication, uncomplicated: Secondary | ICD-10-CM | POA: Insufficient documentation

## 2017-06-12 DIAGNOSIS — F1994 Other psychoactive substance use, unspecified with psychoactive substance-induced mood disorder: Secondary | ICD-10-CM | POA: Diagnosis present

## 2017-06-12 DIAGNOSIS — F332 Major depressive disorder, recurrent severe without psychotic features: Secondary | ICD-10-CM | POA: Diagnosis present

## 2017-06-12 DIAGNOSIS — F1910 Other psychoactive substance abuse, uncomplicated: Secondary | ICD-10-CM

## 2017-06-12 DIAGNOSIS — T4272XA Poisoning by unspecified antiepileptic and sedative-hypnotic drugs, intentional self-harm, initial encounter: Secondary | ICD-10-CM | POA: Insufficient documentation

## 2017-06-12 DIAGNOSIS — F17210 Nicotine dependence, cigarettes, uncomplicated: Secondary | ICD-10-CM | POA: Insufficient documentation

## 2017-06-12 LAB — CBC WITH DIFFERENTIAL/PLATELET
BASOS ABS: 0 10*3/uL (ref 0.0–0.1)
Basophils Relative: 0 %
EOS PCT: 0 %
Eosinophils Absolute: 0 10*3/uL (ref 0.0–0.7)
HEMATOCRIT: 42.7 % (ref 39.0–52.0)
Hemoglobin: 14.8 g/dL (ref 13.0–17.0)
LYMPHS ABS: 1.2 10*3/uL (ref 0.7–4.0)
Lymphocytes Relative: 14 %
MCH: 31.4 pg (ref 26.0–34.0)
MCHC: 34.7 g/dL (ref 30.0–36.0)
MCV: 90.5 fL (ref 78.0–100.0)
MONO ABS: 0.4 10*3/uL (ref 0.1–1.0)
Monocytes Relative: 5 %
NEUTROS ABS: 6.6 10*3/uL (ref 1.7–7.7)
Neutrophils Relative %: 81 %
PLATELETS: 241 10*3/uL (ref 150–400)
RBC: 4.72 MIL/uL (ref 4.22–5.81)
RDW: 13 % (ref 11.5–15.5)
WBC: 8.2 10*3/uL (ref 4.0–10.5)

## 2017-06-12 LAB — RAPID URINE DRUG SCREEN, HOSP PERFORMED
Amphetamines: NOT DETECTED
BARBITURATES: NOT DETECTED
Benzodiazepines: POSITIVE — AB
Cocaine: POSITIVE — AB
Opiates: NOT DETECTED
TETRAHYDROCANNABINOL: POSITIVE — AB

## 2017-06-12 LAB — COMPREHENSIVE METABOLIC PANEL
ALBUMIN: 3.3 g/dL — AB (ref 3.5–5.0)
ALT: 16 U/L — AB (ref 17–63)
AST: 24 U/L (ref 15–41)
Alkaline Phosphatase: 55 U/L (ref 38–126)
Anion gap: 9 (ref 5–15)
BUN: 9 mg/dL (ref 6–20)
CHLORIDE: 107 mmol/L (ref 101–111)
CO2: 23 mmol/L (ref 22–32)
CREATININE: 1.03 mg/dL (ref 0.61–1.24)
Calcium: 8 mg/dL — ABNORMAL LOW (ref 8.9–10.3)
GFR calc Af Amer: >60 mL/min (ref >60)
GFR calc non Af Amer: >60 mL/min (ref >60)
Glucose, Bld: 99 mg/dL (ref 65–99)
POTASSIUM: 3.2 mmol/L — AB (ref 3.5–5.1)
SODIUM: 139 mmol/L (ref 135–145)
Total Bilirubin: 0.2 mg/dL — ABNORMAL LOW (ref 0.3–1.2)
Total Protein: 5.5 g/dL — ABNORMAL LOW (ref 6.5–8.1)

## 2017-06-12 LAB — I-STAT CHEM 8, ED
BUN: 8 mg/dL (ref 6–20)
CALCIUM ION: 1.06 mmol/L — AB (ref 1.15–1.40)
CHLORIDE: 104 mmol/L (ref 101–111)
CREATININE: 1.1 mg/dL (ref 0.61–1.24)
GLUCOSE: 94 mg/dL (ref 65–99)
HCT: 42 % (ref 39.0–52.0)
Hemoglobin: 14.3 g/dL (ref 13.0–17.0)
POTASSIUM: 3.2 mmol/L — AB (ref 3.5–5.1)
Sodium: 140 mmol/L (ref 135–145)
TCO2: 23 mmol/L (ref 0–100)

## 2017-06-12 LAB — URINALYSIS, ROUTINE W REFLEX MICROSCOPIC
Bilirubin Urine: NEGATIVE
GLUCOSE, UA: NEGATIVE mg/dL
Hgb urine dipstick: NEGATIVE
Ketones, ur: NEGATIVE mg/dL
LEUKOCYTES UA: NEGATIVE
NITRITE: NEGATIVE
PH: 6 (ref 5.0–8.0)
Protein, ur: NEGATIVE mg/dL
SPECIFIC GRAVITY, URINE: 1.004 — AB (ref 1.005–1.030)

## 2017-06-12 LAB — ACETAMINOPHEN LEVEL: Acetaminophen (Tylenol), Serum: <10 ug/mL — ABNORMAL LOW (ref 10–30)

## 2017-06-12 LAB — ETHANOL: Alcohol, Ethyl (B): 134 mg/dL — ABNORMAL HIGH (ref <5)

## 2017-06-12 LAB — SALICYLATE LEVEL

## 2017-06-12 MED ORDER — ADULT MULTIVITAMIN W/MINERALS CH
1.0000 | ORAL_TABLET | Freq: Every day | ORAL | Status: DC
  Administered 2017-06-12 – 2017-06-13 (×2): 1 via ORAL
  Filled 2017-06-12: qty 1

## 2017-06-12 MED ORDER — ONDANSETRON 4 MG PO TBDP
4.0000 mg | ORAL_TABLET | Freq: Four times a day (QID) | ORAL | Status: DC | PRN
Start: 2017-06-12 — End: 2017-06-13

## 2017-06-12 MED ORDER — ACETAMINOPHEN 325 MG PO TABS
650.0000 mg | ORAL_TABLET | Freq: Once | ORAL | Status: AC
  Administered 2017-06-12: 650 mg via ORAL
  Filled 2017-06-12: qty 2

## 2017-06-12 MED ORDER — POTASSIUM CHLORIDE 10 MEQ/100ML IV SOLN
10.0000 meq | Freq: Once | INTRAVENOUS | Status: AC
  Administered 2017-06-12: 10 meq via INTRAVENOUS
  Filled 2017-06-12: qty 100

## 2017-06-12 MED ORDER — LORAZEPAM 1 MG PO TABS: 1.0000 mg | ORAL_TABLET | Freq: Two times a day (BID) | ORAL | Status: DC

## 2017-06-12 MED ORDER — THIAMINE HCL 100 MG/ML IJ SOLN
100.0000 mg | Freq: Once | INTRAMUSCULAR | Status: AC
  Administered 2017-06-12: 100 mg via INTRAMUSCULAR
  Filled 2017-06-12: qty 2

## 2017-06-12 MED ORDER — VITAMIN B-1 100 MG PO TABS
100.0000 mg | ORAL_TABLET | Freq: Every day | ORAL | Status: DC
  Administered 2017-06-13: 100 mg via ORAL
  Filled 2017-06-12: qty 1

## 2017-06-12 MED ORDER — LORAZEPAM 1 MG PO TABS: 1.0000 mg | ORAL_TABLET | Freq: Three times a day (TID) | ORAL | Status: DC

## 2017-06-12 MED ORDER — HYDROXYZINE HCL 25 MG PO TABS: 25.0000 mg | ORAL_TABLET | Freq: Four times a day (QID) | ORAL | Status: DC | PRN

## 2017-06-12 MED ORDER — LOPERAMIDE HCL 2 MG PO CAPS: 2.0000 mg | ORAL_CAPSULE | ORAL | Status: DC | PRN

## 2017-06-12 MED ORDER — LORAZEPAM 1 MG PO TABS: 1.0000 mg | ORAL_TABLET | Freq: Every day | ORAL | Status: DC

## 2017-06-12 MED ORDER — LORAZEPAM 1 MG PO TABS
1.0000 mg | ORAL_TABLET | Freq: Four times a day (QID) | ORAL | Status: AC
  Administered 2017-06-12 – 2017-06-13 (×4): 1 mg via ORAL
  Filled 2017-06-12 (×4): qty 1

## 2017-06-12 MED ORDER — LORAZEPAM 1 MG PO TABS: 1.0000 mg | ORAL_TABLET | Freq: Four times a day (QID) | ORAL | Status: DC | PRN

## 2017-06-12 NOTE — ED Notes (Signed)
[facility]
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
## 2017-06-12 NOTE — BH Assessment (Addendum)
Assessment Note  Jason Wilcox is an 31 y.o. male with history of heroin abuse. He presents to [facility], voluntarily. He was BIB by EMS. Patient was reportedly found lying in the grass, unresponsive. Patient reportedly told EMS that he had taken an unknown amount of sleeping pills to commit suicide as well as crack and heroin. Patient also voiced suicidal thoughts. Writer conducted a TTS assessment on this patient. He denied suicidal ideations. Patient denies making statements earlier to EMS. Sts that he doesn't have much memory of what occurred this morning or last night. He admits that he had to much to drink. He recalls drinking 5 beers and 1-2 shots. He also admits to use of cocaine and thc last night. Patient is + for Benzo's but denies use.   He adamentally denies throughout the assessment that he was trying to harm himself. He denies a history of suicidal ideations. No self self mutilating behaviors. No family history reported. He does report depressive symptoms including loss of interest in usual pleasures and fatigue. He denies HI. Currently calm and cooperative. He has pending legal charges (embezzlement). Patient is scheduled for court July 03, 2017. No AVH's. He does not appear to be responding to internal stimuli. No history of INPT mental health and/or substance abuse treatment. He does not have a current outpatient therapist or psychiatrist. He has sought treatment with AA on various occasions.   Diagnosis: Major Depressive Disorder, Recurrent, Severe, without psychotic features, Cocaine Use Disorder, Alcohol Use Disorder, [place] Use Disorder    Past Medical History:  Past Medical History:  Diagnosis Date  . Heroin abuse     Past Surgical History:  Procedure Laterality Date  . FRACTURE SURGERY     nose    Family History: No family history on file.  Social History:  reports that he has been smoking Cigarettes.  He has a 2.50 pack-year smoking history. His smokeless tobacco use  includes Chew. He reports that he drinks alcohol. He reports that he uses drugs, including Marijuana, IV, and Cocaine.  Additional Social History:  Alcohol / Drug Use Pain Medications: SEE MAR Prescriptions: SEE MAR Over the Counter: SEE MAR History of alcohol / drug use?: Yes Negative Consequences of Use: Legal, Personal relationships, Financial Substance #1 Name of Substance 1: Alcohol  1 - Age of First Use: 31 yrs old  1 - Amount (size/oz): several beers and a few shots of liqour 1 - Frequency: "I rarely drink" 1 - Duration: on-going  1 - Last Use / Amount: "last night"; 06/11/2017; 5 beers and a few shots of liqour Substance #2 Name of Substance 2: Cocaine 2 - Age of First Use: 31 yrs old 2 - Amount (size/oz): "I don't know.Marland Kitchenit's hard to say" 2 - Frequency: daily for the past 6 months 2 - Duration: on-going  2 - Last Use / Amount: "last night"; 06/11/2017; Substance #3 Name of Substance 3: Benzodiazepines; (UDS is positive); Patient denies use 3 - Age of First Use: unk 3 - Amount (size/oz): unk 3 - Frequency: unk 3 - Duration: unk 3 - Last Use / Amount: unk Substance #4 Name of Substance 4: THC 4 - Age of First Use: 31 yrs old  4 - Amount (size/oz): 1 joint  4 - Frequency: daily  4 - Duration: on-going  4 - Last Use / Amount: 06/11/2017  CIWA: CIWA-Ar BP: 110/81 Pulse Rate: (!) 59 COWS:    Allergies: No Known Allergies  Home Medications:  (Not in a hospital admission)  OB/GYN Status:  No LMP for male patient.  General Assessment Data Location of Assessment: [facility] ED TTS Assessment: In system Is this a Tele or Face-to-Face Assessment?: Face-to-Face Is this an Initial Assessment or a Re-assessment for this encounter?: Initial Assessment Marital status: Single Maiden name:  (n/a) Is patient pregnant?: No Pregnancy Status: No Living Arrangements: Other (Comment) (homeless ) Can pt return to current living arrangement?: Yes Admission Status: Voluntary Is patient  capable of signing voluntary admission?: Yes Referral Source: Self/Family/Friend Insurance type: Self Pay     Crisis Care Plan Living Arrangements: Other (Comment) (homeless ) Legal Guardian: Other: (no legal guardian ) Name of Psychiatrist:  (n psychiatrist ) Name of Therapist:  (no therapist )  Education Status Is patient currently in school?: No Current Grade:  (n/a) Highest grade of school patient has completed:  (GED) Name of school:  (n/a) Contact person:  (n/a)  Risk to self with the past 6 months Suicidal Ideation: No Has patient been a risk to self within the past 6 months prior to admission? : No Suicidal Intent: No Has patient had any suicidal intent within the past 6 months prior to admission? : No Is patient at risk for suicide?: No Suicidal Plan?: No Has patient had any suicidal plan within the past 6 months prior to admission? : No Access to Means: No What has been your use of drugs/alcohol within the last 12 months?:  (cocaine and thc; alcohol; UDS + for Benzo's (pt denies)) Previous Attempts/Gestures: No How many times?:  (0) Other Self Harm Risks:  (denies ) Triggers for Past Attempts: Other (Comment) (no prior triggers ) Intentional Self Injurious Behavior: None (no self injurious behaviors ) Family Suicide History: No Recent stressful life event(s): Other (Comment) (homeless and no suppports) Persecutory voices/beliefs?: No Depression: Yes Depression Symptoms: Feeling worthless/self pity, Guilt, Fatigue, Isolating, Loss of interest in usual pleasures Substance abuse history and/or treatment for substance abuse?: No Suicide prevention information given to non-admitted patients: Not applicable  Risk to Others within the past 6 months Homicidal Ideation: No Does patient have any lifetime risk of violence toward others beyond the six months prior to admission? : No Thoughts of Harm to Others: No Current Homicidal Intent: No Current Homicidal Plan:  No Access to Homicidal Means: No Identified Victim:  (n/a) History of harm to others?: No Assessment of Violence: None Noted Violent Behavior Description:  (patient is calm and cooperative ) Does patient have access to weapons?: No Criminal Charges Pending?: Yes Describe Pending Criminal Charges:  Firefighter(Embezzlement) Does patient have a court date: Yes Court Date:  (07/03/2017) Is patient on probation?: No  Psychosis Hallucinations: None noted Delusions: None noted  Mental Status Report Appearance/Hygiene: Disheveled, In scrubs Eye Contact: Fair Motor Activity: Freedom of movement Speech: Logical/coherent Level of Consciousness: Alert Mood: Depressed Affect: Appropriate to circumstance Anxiety Level: None Thought Processes: Relevant, Coherent Judgement: Impaired Orientation: Time, Situation, Place, Person Obsessive Compulsive Thoughts/Behaviors: None  Cognitive Functioning Concentration: Decreased Memory: Recent Intact, Remote Intact IQ: Average Insight: Fair Impulse Control: Fair Appetite: Fair Weight Loss:  (none reported) Weight Gain:  (none reported) Sleep: No Change Total Hours of Sleep:  (6-8 hrs ) Vegetative Symptoms: None  ADLScreening [facility]) Patient's cognitive ability adequate to safely complete daily activities?: Yes Patient able to express need for assistance with ADLs?: Yes Independently performs ADLs?: Yes (appropriate for developmental age)  Prior Inpatient Therapy Prior Inpatient Therapy: No Prior Therapy Dates:  (n/a) Prior Therapy Facilty/Provider(s):  (n/a) Reason for  Treatment:  (no history of INPT treatment )  Prior Outpatient Therapy Prior Outpatient Therapy: Yes Prior Therapy Dates:  (n/a) Prior Therapy Facilty/Provider(s):  ([facility] groups ) Reason for Treatment:  (alcohol use ) Does patient have an ACCT team?: No Does patient have Intensive In-House Services?  : No Does patient have [facility] services? : No Does patient  have P4CC services?: No  ADL Screening (condition at time of admission) Patient's cognitive ability adequate to safely complete daily activities?: Yes Patient able to express need for assistance with ADLs?: Yes Independently performs ADLs?: Yes (appropriate for developmental age)             Merchant navy officer (For Healthcare) Does Patient Have a Medical Advance Directive?: No Would patient like information on creating a medical advance directive?: No - Patient declined    Additional Information 1:1 In Past 12 Months?: No CIRT Risk: No Elopement Risk: No Does patient have medical clearance?: Yes     Disposition:  Disposition Initial Assessment Completed for this Encounter: Yes (Pending am psych evaluation ) Disposition of Patient: Other dispositions (Per Elta Guadeloupe, NP, overnight observation) Other disposition(s): Other (Comment) (Pending am psych evaluation )  On Site Evaluation by:   Reviewed with Physician:     Melynda Ripple 06/12/2017 12:04 PM

## 2017-06-12 NOTE — ED Provider Notes (Signed)
[facility]-EMERGENCY DEPT Provider Note   CSN: [id number] Arrival date & time: 06/12/17  0155  By signing my name below, I, Rosana Fret, attest that this documentation has been prepared under the direction and in the presence of Dione Booze, MD. Electronically Signed: Rosana Fret, ED Scribe. 06/12/17. 3:29 AM.  History   Chief Complaint Chief Complaint  Patient presents with  . Drug Overdose     History cannot be obtained. LEVEL 5 CAVEAT DUE TO AMS  Past Medical History:  Diagnosis Date  . Heroin abuse     There are no active problems to display for this patient.   Past Surgical History:  Procedure Laterality Date  . FRACTURE SURGERY     nose       Home Medications    Prior to Admission medications   Medication Sig Start Date End Date Taking? Authorizing Provider  cyclobenzaprine (FLEXERIL) 10 MG tablet Take 1 tablet (10 mg total) by mouth 2 (two) times daily as needed for muscle spasms. Patient not taking: Reported on 05/15/2016 04/22/16   Arby Barrette, MD  diphenhydramine-acetaminophen (TYLENOL PM) 25-500 MG TABS tablet Take 1 tablet by mouth at bedtime as needed (for pain and sleep).    [provider]  naproxen (NAPROSYN) 500 MG tablet Take 1 tablet (500 mg total) by mouth 2 (two) times daily with a meal. 12/29/16   Everlene Farrier, PA-C    Family History No family history on file.  Social History Social History  Substance Use Topics  . Smoking status: Current Some Day Smoker    Packs/day: 0.25    Years: 10.00    Types: Cigarettes  . Smokeless tobacco: Current User    Types: Chew  . Alcohol use Yes     Allergies   Patient has no known allergies.   Review of Systems Review of Systems ROS cannot be preformed.  LEVEL 5 CAVEAT DUE TO AMS  Physical Exam Updated Vital Signs BP 106/79 (BP Location: Left Arm)   Pulse 79   Resp 17   SpO2 97%   Physical Exam  Constitutional: He appears well-developed and well-nourished.  Responds to  deep painful stimuli but not verbal stimuli.   HENT:  Head: Normocephalic and atraumatic.  Eyes: EOM are normal.  Pupils 3 mm and minimally reactive to light.   Neck: Normal range of motion. Neck supple. No JVD present.  Cardiovascular: Normal rate, regular rhythm and normal heart sounds.   No murmur heard. 2/6 systolic injection murmur heard over the left sternal border.   Pulmonary/Chest: Effort normal and breath sounds normal. He has no wheezes. He has no rales. He exhibits no tenderness.  Abdominal: Soft. Bowel sounds are normal. He exhibits no distension and no mass. There is no tenderness.  Musculoskeletal: Normal range of motion. He exhibits no edema.  Lymphadenopathy:    He has no cervical adenopathy.  Neurological: No cranial nerve deficit. He exhibits normal muscle tone. Coordination normal.  Mental status as noted above. No obvious motor or sensory deficit. Moves all extremities equally.   Skin: Skin is warm and dry. No rash noted.  Nursing note and vitals reviewed.    ED Treatments / Results  DIAGNOSTIC STUDIES: Oxygen Saturation is 97% on RA, normal by my interpretation.   Labs (all labs ordered are listed, but only abnormal results are displayed) Labs Reviewed  RAPID URINE DRUG SCREEN, HOSP PERFORMED - Abnormal; Notable for the following:       Result Value   Cocaine POSITIVE (*)  Benzodiazepines POSITIVE (*)    Tetrahydrocannabinol POSITIVE (*)    All other components within normal limits  COMPREHENSIVE METABOLIC PANEL - Abnormal; Notable for the following:    Potassium 3.2 (*)    Calcium 8.0 (*)    Total Protein 5.5 (*)    Albumin 3.3 (*)    ALT 16 (*)    Total Bilirubin 0.2 (*)    All other components within normal limits  ETHANOL - Abnormal; Notable for the following:    Alcohol, Ethyl (B) 134 (*)    All other components within normal limits  ACETAMINOPHEN LEVEL - Abnormal; Notable for the following:    Acetaminophen (Tylenol), Serum <10 (*)    All  other components within normal limits  URINALYSIS, ROUTINE W REFLEX MICROSCOPIC - Abnormal; Notable for the following:    Color, Urine STRAW (*)    Specific Gravity, Urine 1.004 (*)    All other components within normal limits  I-STAT CHEM 8, ED - Abnormal; Notable for the following:    Potassium 3.2 (*)    Calcium, Ion 1.06 (*)    All other components within normal limits  CBC WITH DIFFERENTIAL/PLATELET  SALICYLATE LEVEL    EKG  EKG Interpretation  Date/Time:  Wednesday June 12 2017 02:20:11 EDT Ventricular Rate:  78 PR Interval:    QRS Duration: 89 QT Interval:  419 QTC Calculation: 478 R Axis:   83 Text Interpretation:  Sinus rhythm Consider right atrial enlargement Minimal ST depression, inferior leads Borderline prolonged QT interval When compared with ECG of 06/28/2015, QT has lengthened Premature ventricular complexes are no longer present Confirmed by Dione BoozeGlick, Lizmary Nader ([id number]) on 06/12/2017 5:06:00 AM       Procedures Procedures (including critical care time) CRITICAL CARE Performed by: UEAVW,UJWJXGLICK,Glendon Fiser Total critical care time: 70 minutes Critical care time was exclusive of separately billable procedures and treating other patients. Critical care was necessary to treat or prevent imminent or life-threatening deterioration. Critical care was time spent personally by me on the following activities: development of treatment plan with patient and/or surrogate as well as nursing, discussions with consultants, evaluation of patient's response to treatment, examination of patient, obtaining history from patient or surrogate, ordering and performing treatments and interventions, ordering and review of laboratory studies, ordering and review of radiographic studies, pulse oximetry and re-evaluation of patient's condition.   Medications Ordered in ED Medications  potassium chloride 10 mEq in 100 mL IVPB (10 mEq Intravenous New Bag/Given 06/12/17 0555)     Initial Impression / Assessment  and Plan / ED Course  I have reviewed the triage vital signs and the nursing notes.  Pertinent labs & imaging results that were available during my care of the patient were reviewed by me and considered in my medical decision making (see chart for details).  Patient with apparent overdose with suicidal intent. Psychiatric evaluation patient cannot be done because of patient being obtunded because of EMS requiring sedation for safe transport. Patient has rejected his airway and maintained good oxygen saturation in the ED. Old records are reviewed showing prior ED visit for substance abuse. Laboratory workup shows mild hypokalemia which is treated with intravenous potassium. Drug screen is positive for benzodiazepines, cocaine, marijuana. He did receive midazolam in the ambulance, which may account for the benzodiazepines in the drug screen. Patient has been observed and reevaluated. On reevaluation, he does respond to voice but is not able to converse. He will need to be observed in the ED until he has awakened sufficiently  to have psychiatric evaluation. Case is signed out to Dr. Jeraldine Loots.  Final Clinical Impressions(s) / ED Diagnoses   Final diagnoses:  Suicide attempt by multiple drug overdose, initial encounter (HCC)  Hypokalemia  Polysubstance abuse  Alcohol intoxication, uncomplicated (HCC)    New Prescriptions New Prescriptions   No medications on file   I personally performed the services described in this documentation, which was scribed in my presence. The recorded information has been reviewed and is accurate.       Dione Booze, MD 06/12/17 [phone]

## 2017-06-12 NOTE — Progress Notes (Signed)
Pt is in bed at shift change sleeping.  Pt wakes up for assessment and meds.  Pt sts he just wants to sleep until he is discharged which he hopes is soon.  Pt denies pain or discomfort and denies SI, HI and AVH.  Pt verbally contracts for safety.  Pt remains safe on unit.

## 2017-06-12 NOTE — ED Notes (Signed)
Patient has one belonging bag in cabinet on blue side nurse's station
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
## 2017-06-13 DIAGNOSIS — Y909 Presence of alcohol in blood, level not specified: Secondary | ICD-10-CM

## 2017-06-13 DIAGNOSIS — F17210 Nicotine dependence, cigarettes, uncomplicated: Secondary | ICD-10-CM

## 2017-06-13 DIAGNOSIS — F1910 Other psychoactive substance abuse, uncomplicated: Secondary | ICD-10-CM

## 2017-06-13 DIAGNOSIS — F332 Major depressive disorder, recurrent severe without psychotic features: Secondary | ICD-10-CM

## 2017-06-13 DIAGNOSIS — F1994 Other psychoactive substance use, unspecified with psychoactive substance-induced mood disorder: Secondary | ICD-10-CM | POA: Diagnosis present

## 2017-06-13 DIAGNOSIS — F10920 Alcohol use, unspecified with intoxication, uncomplicated: Secondary | ICD-10-CM

## 2017-06-13 NOTE — ED Notes (Signed)
Pt d/c home per MD order. Discharge summary reviewed with pt. Pt verbalizes understanding. Pt denies SI/HI/AVH. Pt signed for personal property and property returned to pt. Pt signed e-signature. Ambulatory off unit with MHT.  

## 2017-06-13 NOTE — Consult Note (Signed)
[facility] Consult   Reason for Consult:  Intoxication, suicidal ideation when intoxicated Referring Physician:  EDP Patient Identification: Jason Wilcox MRN:  [id number] Principal Diagnosis: Substance induced mood disorder ([place]) Diagnosis:   Patient Active Problem List   Diagnosis Date Noted  . Substance induced mood disorder ([place]) [F19.94] 06/13/2017    Priority: High  . MDD (major depressive disorder), recurrent severe, without psychosis ([place]) [F33.2] 06/13/2017    Priority: High  . Alcohol intoxication, uncomplicated ([place]) [T70.017]   . Polysubstance abuse [F19.10]     Total Time spent with patient: 30 minutes   Subjective:   Jason Wilcox is a 31 y.o. male patient admitted with reports of intoxication and multiple substances in his system, combative with support staff. Pt seen and chart reviewed. Pt is alert/oriented x4, calm, cooperative, and appropriate to situation. Pt denies suicidal/homicidal ideation and psychosis and does not appear to be responding to internal stimuli. Pt would like to discharge home and go to AA. Pt reports that he was intoxicated and has no desire to harm himself.   HPI:  I have reviewed and concur with HPI elements, modified as follows: "Jason Wilcox is an 31 y.o. male with history of heroin abuse. He presents to [facility], voluntarily. He was BIB by EMS. Patient was reportedly found lying in the grass, unresponsive. Patient reportedly told EMS that he had taken an unknown amount of sleeping pills to commit suicide as well as crack and heroin. Patient also voiced suicidal thoughts. Writer conducted a TTS assessment on this patient. He denied suicidal ideations. Patient denies making statements earlier to EMS. Sts that he doesn't have much memory of what occurred this morning or last night. He admits that he had to much to drink. He recalls drinking 5 beers and 1-2 shots. He also admits to use of cocaine and thc last night. Patient is +  for Benzo's but denies use.   He adamentally denies throughout the assessment that he was trying to harm himself. He denies a history of suicidal ideations. No self self mutilating behaviors. No family history reported. He does report depressive symptoms including loss of interest in usual pleasures and fatigue. He denies HI. Currently calm and cooperative. He has pending legal charges (embezzlement). Patient is scheduled for court July 03, 2017. No AVH's. He does not appear to be responding to internal stimuli. No history of INPT mental health and/or substance abuse treatment. He does not have a current outpatient therapist or psychiatrist. He has sought treatment with AA on various occasions."   On 06/13/17, pt seen as above. Pt has been cooperative with staff and denies suicidal ideation since arrival.   Past Psychiatric History: substance abuse   Risk to Self: Suicidal Ideation: No Suicidal Intent: No Is patient at risk for suicide?: No Suicidal Plan?: No Access to Means: No What has been your use of drugs/alcohol within the last 12 months?:  (cocaine and thc; alcohol; UDS + for Benzo's (pt denies)) How many times?:  (0) Other Self Harm Risks:  (denies ) Triggers for Past Attempts: Other (Comment) (no prior triggers ) Intentional Self Injurious Behavior: None (no self injurious behaviors ) Risk to Others: Homicidal Ideation: No Thoughts of Harm to Others: No Current Homicidal Intent: No Current Homicidal Plan: No Access to Homicidal Means: No Identified Victim:  (n/a) History of harm to others?: No Assessment of Violence: None Noted Violent Behavior Description:  (patient is calm and cooperative ) Does patient have access to  weapons?: No Criminal Charges Pending?: Yes Describe Pending Criminal Charges:  (Embezzlement) Does patient have a court date: Yes Court Date:  (07/03/2017) Prior Inpatient Therapy: Prior Inpatient Therapy: No Prior Therapy Dates:  (n/a) Prior Therapy  Facilty/Provider(s):  (n/a) Reason for Treatment:  (no history of INPT treatment ) Prior Outpatient Therapy: Prior Outpatient Therapy: Yes Prior Therapy Dates:  (n/a) Prior Therapy Facilty/Provider(s):  (AA groups ) Reason for Treatment:  (alcohol use ) Does patient have an ACCT team?: No Does patient have Intensive In-House Services?  : No Does patient have [facility] services? : No Does patient have P4CC services?: No  Past Medical History:  Past Medical History:  Diagnosis Date  . Heroin abuse     Past Surgical History:  Procedure Laterality Date  . FRACTURE SURGERY     nose   Family History: No family history on file. Family Psychiatric  History: depression Social History:  History  Alcohol Use  . Yes     History  Drug Use  . Types: Marijuana, IV, Cocaine    Comment: in recovery    Social History   Social History  . Marital status: Single    Spouse name: N/A  . Number of children: N/A  . Years of education: N/A   Social History Main Topics  . Smoking status: Current Some Day Smoker    Packs/day: 0.25    Years: 10.00    Types: Cigarettes  . Smokeless tobacco: Current User    Types: Chew  . Alcohol use Yes  . Drug use: Yes    Types: Marijuana, IV, Cocaine     Comment: in recovery  . Sexual activity: Not on file   Other Topics Concern  . Not on file   Social History Narrative  . No narrative on file   Additional Social History:    Allergies:  No Known Allergies  Labs:  Results for orders placed or performed during the hospital encounter of 06/12/17 (from the past 48 hour(s))  Comprehensive metabolic panel     Status: Abnormal   Collection Time: 06/12/17  2:40 AM  Result Value Ref Range   Sodium 139 135 - 145 mmol/L   Potassium 3.2 (L) 3.5 - 5.1 mmol/L   Chloride 107 101 - 111 mmol/L   CO2 23 22 - 32 mmol/L   Glucose, Bld 99 65 - 99 mg/dL   BUN 9 6 - 20 mg/dL   Creatinine, Ser 1.03 0.61 - 1.24 mg/dL   Calcium 8.0 (L) 8.9 - 10.3 mg/dL   Total  Protein 5.5 (L) 6.5 - 8.1 g/dL   Albumin 3.3 (L) 3.5 - 5.0 g/dL   AST 24 15 - 41 U/L   ALT 16 (L) 17 - 63 U/L   Alkaline Phosphatase 55 38 - 126 U/L   Total Bilirubin 0.2 (L) 0.3 - 1.2 mg/dL   GFR calc non Af Amer >60 >60 mL/min   GFR calc Af Amer >60 >60 mL/min    Comment: (NOTE) The eGFR has been calculated using the CKD EPI equation. This calculation has not been validated in all clinical situations. eGFR's persistently <60 mL/min signify possible Chronic Kidney Disease.    Anion gap 9 5 - 15  Ethanol     Status: Abnormal   Collection Time: 06/12/17  2:40 AM  Result Value Ref Range   Alcohol, Ethyl (B) 134 (H) <5 mg/dL    Comment:        LOWEST DETECTABLE LIMIT FOR SERUM ALCOHOL IS 5   mg/dL FOR MEDICAL PURPOSES ONLY   CBC with Diff     Status: None   Collection Time: 06/12/17  2:40 AM  Result Value Ref Range   WBC 8.2 4.0 - 10.5 K/uL   RBC 4.72 4.22 - 5.81 MIL/uL   Hemoglobin 14.8 13.0 - 17.0 g/dL   HCT 42.7 39.0 - 52.0 %   MCV 90.5 78.0 - 100.0 fL   MCH 31.4 26.0 - 34.0 pg   MCHC 34.7 30.0 - 36.0 g/dL   RDW 13.0 11.5 - 15.5 %   Platelets 241 150 - 400 K/uL   Neutrophils Relative % 81 %   Neutro Abs 6.6 1.7 - 7.7 K/uL   Lymphocytes Relative 14 %   Lymphs Abs 1.2 0.7 - 4.0 K/uL   Monocytes Relative 5 %   Monocytes Absolute 0.4 0.1 - 1.0 K/uL   Eosinophils Relative 0 %   Eosinophils Absolute 0.0 0.0 - 0.7 K/uL   Basophils Relative 0 %   Basophils Absolute 0.0 0.0 - 0.1 K/uL  Salicylate level     Status: None   Collection Time: 06/12/17  2:40 AM  Result Value Ref Range   Salicylate Lvl <1.6 2.8 - 30.0 mg/dL  Acetaminophen level     Status: Abnormal   Collection Time: 06/12/17  2:40 AM  Result Value Ref Range   Acetaminophen (Tylenol), Serum <10 (L) 10 - 30 ug/mL    Comment:        THERAPEUTIC CONCENTRATIONS VARY SIGNIFICANTLY. A RANGE OF 10-30 ug/mL MAY BE AN EFFECTIVE CONCENTRATION FOR MANY PATIENTS. HOWEVER, SOME ARE BEST TREATED AT CONCENTRATIONS OUTSIDE  THIS RANGE. ACETAMINOPHEN CONCENTRATIONS >150 ug/mL AT 4 HOURS AFTER INGESTION AND >50 ug/mL AT 12 HOURS AFTER INGESTION ARE OFTEN ASSOCIATED WITH TOXIC REACTIONS.   Rapid urine drug screen (hospital performed)     Status: Abnormal   Collection Time: 06/12/17  2:57 AM  Result Value Ref Range   Opiates NONE DETECTED NONE DETECTED   Cocaine POSITIVE (A) NONE DETECTED   Benzodiazepines POSITIVE (A) NONE DETECTED   Amphetamines NONE DETECTED NONE DETECTED   Tetrahydrocannabinol POSITIVE (A) NONE DETECTED   Barbiturates NONE DETECTED NONE DETECTED    Comment:        DRUG SCREEN FOR MEDICAL PURPOSES ONLY.  IF CONFIRMATION IS NEEDED FOR ANY PURPOSE, NOTIFY LAB WITHIN 5 DAYS.        LOWEST DETECTABLE LIMITS FOR URINE DRUG SCREEN Drug Class       Cutoff (ng/mL) Amphetamine      1000 Barbiturate      200 Benzodiazepine   109 Tricyclics       604 Opiates          300 Cocaine          300 THC              50   Urinalysis, Routine w reflex microscopic     Status: Abnormal   Collection Time: 06/12/17  2:57 AM  Result Value Ref Range   Color, Urine STRAW (A) YELLOW   APPearance CLEAR CLEAR   Specific Gravity, Urine 1.004 (L) 1.005 - 1.030   pH 6.0 5.0 - 8.0   Glucose, UA NEGATIVE NEGATIVE mg/dL   Hgb urine dipstick NEGATIVE NEGATIVE   Bilirubin Urine NEGATIVE NEGATIVE   Ketones, ur NEGATIVE NEGATIVE mg/dL   Protein, ur NEGATIVE NEGATIVE mg/dL   Nitrite NEGATIVE NEGATIVE   Leukocytes, UA NEGATIVE NEGATIVE  I-Stat Chem 8, ED     Status: Abnormal  Collection Time: 06/12/17  3:18 AM  Result Value Ref Range   Sodium 140 135 - 145 mmol/L   Potassium 3.2 (L) 3.5 - 5.1 mmol/L   Chloride 104 101 - 111 mmol/L   BUN 8 6 - 20 mg/dL   Creatinine, Ser 1.10 0.61 - 1.24 mg/dL   Glucose, Bld 94 65 - 99 mg/dL   Calcium, Ion 1.06 (L) 1.15 - 1.40 mmol/L   TCO2 23 0 - 100 mmol/L   Hemoglobin 14.3 13.0 - 17.0 g/dL   HCT 42.0 39.0 - 52.0 %    Current Facility-Administered Medications   Medication Dose Route Frequency Provider Last Rate Last Dose  . hydrOXYzine (ATARAX/VISTARIL) tablet 25 mg  25 mg Oral Q6H PRN Parks, Laurie Britton, NP      . loperamide (IMODIUM) capsule 2-4 mg  2-4 mg Oral PRN Parks, Laurie Britton, NP      . LORazepam (ATIVAN) tablet 1 mg  1 mg Oral Q6H PRN Parks, Laurie Britton, NP      . LORazepam (ATIVAN) tablet 1 mg  1 mg Oral TID Parks, Laurie Britton, NP       Followed by  . [START ON 06/14/2017] LORazepam (ATIVAN) tablet 1 mg  1 mg Oral BID Parks, Laurie Britton, NP       Followed by  . [START ON 06/16/2017] LORazepam (ATIVAN) tablet 1 mg  1 mg Oral Daily Parks, Laurie Britton, NP      . multivitamin with minerals tablet 1 tablet  1 tablet Oral Daily Parks, Laurie Britton, NP   1 tablet at 06/13/17 1026  . ondansetron (ZOFRAN-ODT) disintegrating tablet 4 mg  4 mg Oral Q6H PRN Parks, Laurie Britton, NP      . thiamine (VITAMIN B-1) tablet 100 mg  100 mg Oral Daily Parks, Laurie Britton, NP   100 mg at 06/13/17 1026   Current Outpatient Prescriptions  Medication Sig Dispense Refill  . naproxen (NAPROSYN) 500 MG tablet Take 1 tablet (500 mg total) by mouth 2 (two) times daily with a meal. (Patient not taking: Reported on 06/12/2017) 30 tablet 0    Musculoskeletal: Strength & Muscle Tone: within normal limits Gait & Station: normal Patient leans: N/A  Psychiatric Specialty Exam: Physical Exam  Review of Systems  Psychiatric/Behavioral: Positive for depression and substance abuse. Negative for hallucinations and suicidal ideas. The patient has insomnia. The patient is not nervous/anxious.   All other systems reviewed and are negative.   Blood pressure 105/60, pulse (!) 42, temperature 98 F (36.7 C), temperature source Oral, resp. rate 16, SpO2 100 %.There is no height or weight on file to calculate BMI.  General Appearance: Casual and Fairly Groomed  Eye Contact:  Fair  Speech:  Clear and Coherent and Normal Rate  Volume:  Normal  Mood:   Anxious and Depressed  Affect:  Appropriate, Congruent and Depressed  Thought Process:  Coherent, Goal Directed, Linear and Descriptions of Associations: Intact  Orientation:  Full (Time, Place, and Person)  Thought Content:  Focused   Suicidal Thoughts:  No  Homicidal Thoughts:  No  Memory:  Immediate;   Fair Recent;   Fair Remote;   Fair  Judgement:  Fair  Insight:  Fair  Psychomotor Activity:  Normal  Concentration:  Concentration: Fair and Attention Span: Fair  Recall:  Fair  Fund of Knowledge:  Fair  Language:  Fair  Akathisia:  No  Handed:    AIMS (if indicated):     Assets:  Communication Skills Desire   for Improvement Resilience Social Support  ADL's:  Intact  Cognition:  WNL  Sleep:      Treatment Plan Summary: Substance induced mood disorder ([place]) stable for outpatient management.  Disposition: No evidence of imminent risk to self or others at present.   Patient does not meet criteria for psychiatric inpatient admission. Supportive therapy provided about ongoing stressors. Refer to IOP. Discussed crisis plan, support from social network, calling 911, coming to the Emergency Department, and calling Suicide Hotline.  Benjamine Mola, [place] 06/13/2017 10:56 AM  Patient seen face-to-face for psychiatric evaluation, chart reviewed and case discussed with the physician extender and developed treatment plan. Reviewed the information documented and agree with the treatment plan. Corena Pilgrim, MD

## 2017-06-13 NOTE — [facility] Assessment (Signed)
[facility] Assessment Progress Note  Per Thedore MinsMojeed Akintayo, MD, this pt does not require psychiatric hospitalization at this time.  [facility]  He reports that he is already connected with the local Alcoholics Anonymous community, and he does not want any other resources.  Pt's nurse, Morrie Sheldonshley, has been notified.  Doylene Canninghomas Blakelee Allington, MA Triage Specialist [phone]

## 2017-11-21 ENCOUNTER — Ambulatory Visit: Payer: Self-pay | Admitting: Urology

## 2017-11-21 VITALS — BP 123/72 | HR 54 | Temp 98.0°F | Resp 17 | Ht 63.0 in | Wt 161.2 lb

## 2017-11-21 DIAGNOSIS — K219 Gastro-esophageal reflux disease without esophagitis: Secondary | ICD-10-CM

## 2017-11-21 MED ORDER — OMEPRAZOLE 10 MG PO CPDR: 10.0000 mg | DELAYED_RELEASE_CAPSULE | Freq: Every day | ORAL | 11 refills | Status: DC

## 2017-11-21 MED ORDER — OMEPRAZOLE 10 MG PO CPDR: 10.0000 mg | DELAYED_RELEASE_CAPSULE | Freq: Every day | ORAL | 11 refills | Status: AC

## 2017-11-21 NOTE — Progress Notes (Signed)
  Patient: Jason HillierDrew E Ritter Male    DOB: 08/19/1986   31 y.o.   MRN: [id number] Visit Date: 11/21/2017  Today's Provider: [facility]   Chief Complaint  Patient presents with  . New Patient (Initial Visit)  . Medication Refill   Subjective:    Jason Wilcox Wilcox is 31 y/o male with hx of GERD and substance induced mood disorder here for evaluation of GERD.  Reflux Over past few years.  Well-controlled with PPI. Needs form signed for PPI to be administered at facility. Has started to change diet over the past few months, less carbs/calories, more fruits/vegetables.  Previous substance abuse Managed by long-term substance facility. Pt has not used any illicit substances in 5 months. No SI/HI.       No Known Allergies This SmartLink is deprecated. Use AVSMEDLIST instead to display the medication list for a patient.  Review of Systems  All other systems reviewed and are negative.   Social History   Tobacco Use  . Smoking status: Current Some Day Smoker    Packs/day: 0.25    Years: 10.00    Pack years: 2.50    Types: Cigarettes  . Smokeless tobacco: Current User    Types: Chew  Substance Use Topics  . Alcohol use: Yes   Objective:   BP 123/72   Pulse (!) 54   Temp 98 F (36.7 C)   Resp 17   Ht 5\' 3"  (1.6 m)   Wt 161 lb 3.2 oz (73.1 kg)   BMI 28.56 kg/m   Physical Exam  Constitutional: He is oriented to person, place, and time. He appears well-developed and well-nourished.  HENT:  Head: Normocephalic and atraumatic.  Eyes: Conjunctivae are normal.  Cardiovascular: Normal rate, regular rhythm, normal heart sounds and intact distal pulses.  Pulmonary/Chest: Effort normal and breath sounds normal.  Abdominal: Soft. Bowel sounds are normal. He exhibits no distension and no mass. There is no tenderness. There is no guarding.  Neurological: He is alert and oriented to person, place, and time.  Skin: Skin is warm and dry.  Psychiatric: He has a normal mood  and affect.        Assessment & Plan:     Jason Wilcox Kroon is 31 y/o male with hx of GERDhere for management of GERD.  GERD Re-prescribed current omeprazole. Recommended continued diet/exercise regimen improvement.  Substance induced mood disorder Managed by long-stay facility.         [facility]   [facility]

## 2017-12-04 ENCOUNTER — Ambulatory Visit: Payer: Self-pay | Admitting: Pharmacy Technician

## 2017-12-04 NOTE — Progress Notes (Signed)
Patient scheduled for eligibility appointment at [facility].  Patient did not show for the appointment on December 04, 2017 at 9:00a.m.  Patient  rescheduled eligibility appointment to 12/10/17 at 3:30p.m.Marland Kitchen.  [facility] until [facility] is determined.  Sherilyn DacostaBetty J. Azari Hasler Care Manager [facility]

## 2017-12-10 ENCOUNTER — Ambulatory Visit: Payer: Self-pay | Admitting: Pharmacy Technician

## 2017-12-13 ENCOUNTER — Ambulatory Visit: Payer: Self-pay | Admitting: Pharmacy Technician

## 2017-12-13 DIAGNOSIS — Z79899 Other long term (current) drug therapy: Secondary | ICD-10-CM

## 2017-12-13 NOTE — Progress Notes (Signed)
Completed [facility] application and contract.  Patient agreed to all terms of the [facility] contract.    Patient approved to receive medication assistance at [facility] through 2018, as long as eligibility criteria continues to be met.    Provided patient with Civil engineer, contracting based on his particular needs.    [facility]

## 2017-12-13 NOTE — Progress Notes (Signed)
Patient failed to show for eligibility appt on 12/10/17 at 3:30p.m.  Appt rescheduled to 12/13/17 at 3:00p.m.  [name] Care Manager [facility] [name] Care Manager [facility]

## 2018-03-06 IMAGING — CR DG FOOT COMPLETE 3+V*L*
3 series · 3 of 3 positions shown · non-contrast
Comparison: None.

CLINICAL DATA: 30 y/o M; pain to the left foot and toes worsening
over the last few hours. Unable to bear weight. No known injury.

EXAM:
LEFT FOOT - COMPLETE 3+ VIEW

[x foot ap left]
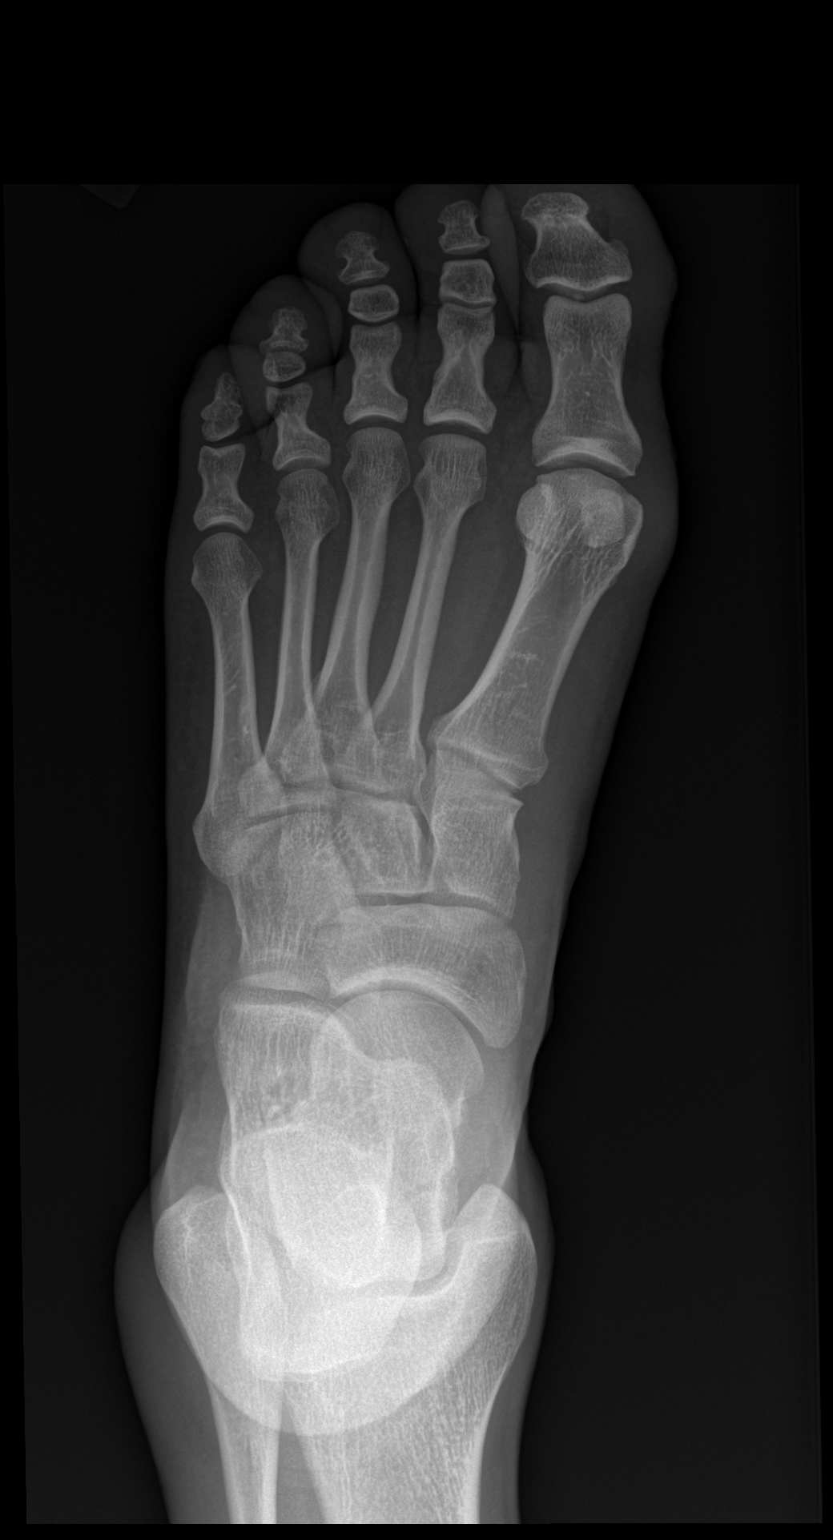

[x foot obl left]
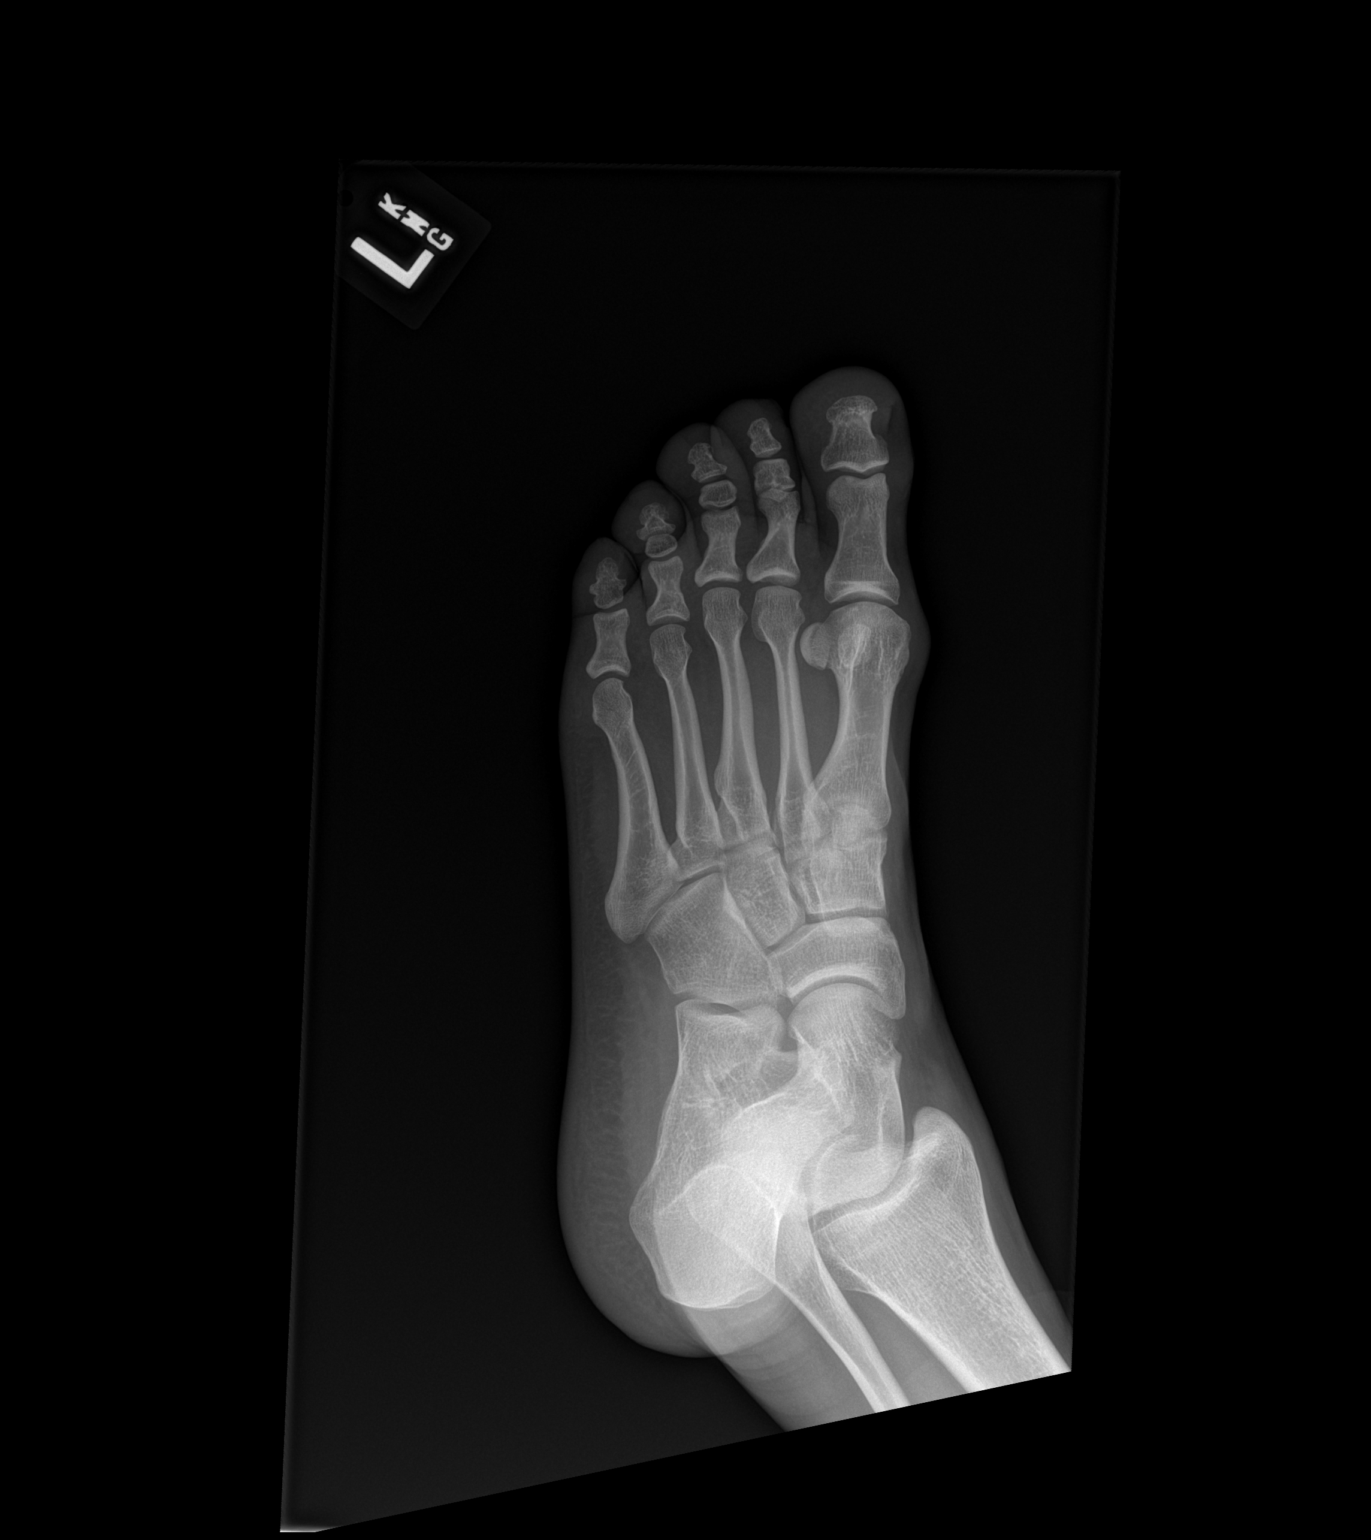

[x foot lat left]
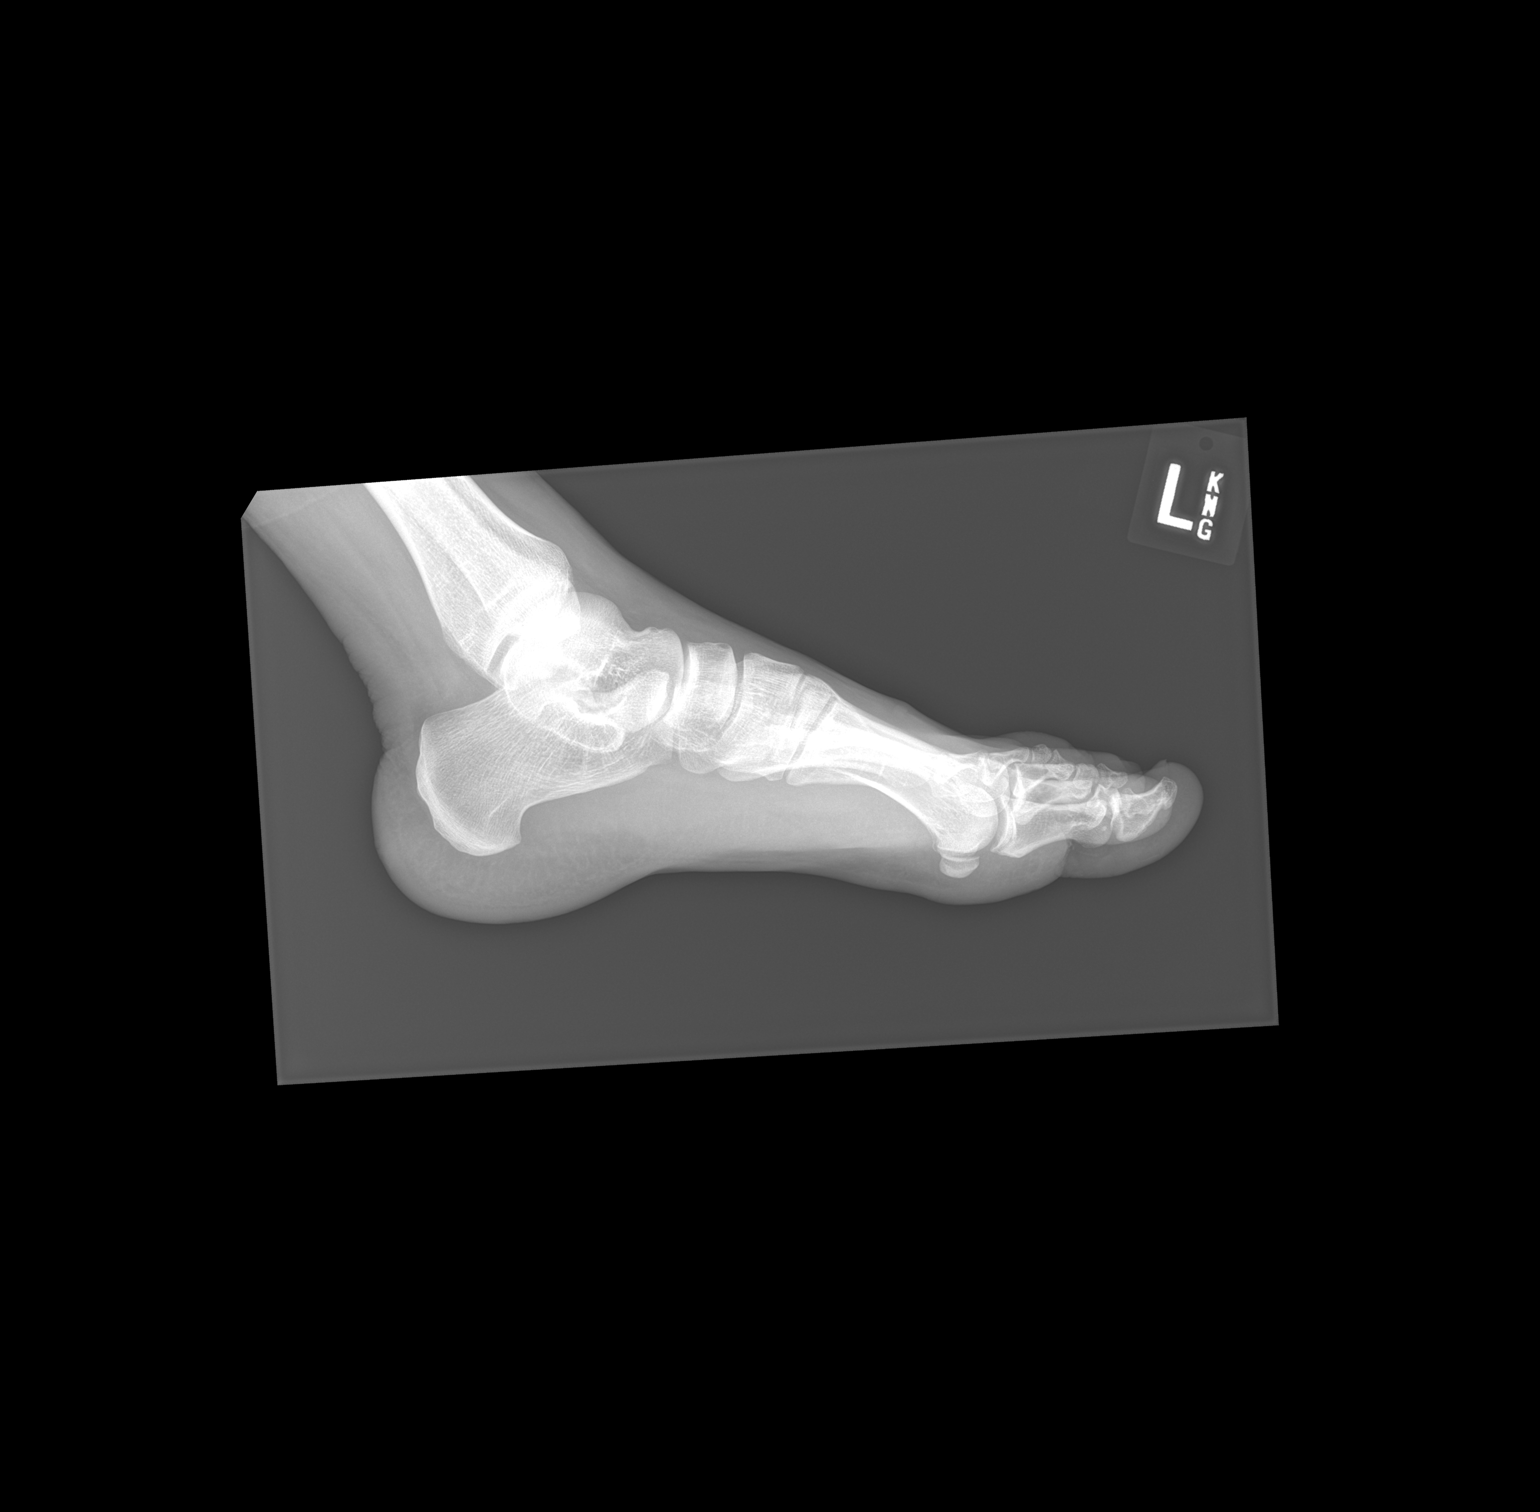

[3 of 3 positions shown; findings below may reference images not displayed]

FINDINGS: There is no evidence of fracture or dislocation. There is no
evidence of arthropathy or other focal bone abnormality. Soft
tissues are unremarkable.
IMPRESSION: Negative.

By: Vismay Luqman M.D.

## 2018-10-16 ENCOUNTER — Telehealth: Payer: Self-pay | Admitting: Pharmacy Technician

## 2018-10-16 NOTE — Telephone Encounter (Signed)
Patient failed to provide 2019 poi.  No additional medication assistance will be provided by [facility] without the required proof of income documentation.  Patient notified by letter.  Theodor Mustin J. Yamil Oelke Care Manager [facility] 

## 2019-10-07 ENCOUNTER — Other Ambulatory Visit: Payer: Self-pay

## 2019-10-07 DIAGNOSIS — Z20822 Contact with and (suspected) exposure to covid-19: Secondary | ICD-10-CM

## 2019-10-08 LAB — NOVEL CORONAVIRUS, NAA: SARS-CoV-2, NAA: NOT DETECTED
